# Patient Record
Sex: Female | Born: 1935 | Race: White | Hispanic: No | Smoking: Never smoker
Health system: Southern US, Community
[De-identification: ages and names within clinical notes are randomized; demographics above are authoritative.]

## PROBLEM LIST (undated history)

## (undated) ENCOUNTER — Emergency Department (HOSPITAL_COMMUNITY): Admission: EM | Payer: Self-pay | Source: Home / Self Care

## (undated) DIAGNOSIS — I10 Essential (primary) hypertension: Secondary | ICD-10-CM

## (undated) DIAGNOSIS — R519 Headache, unspecified: Secondary | ICD-10-CM

## (undated) DIAGNOSIS — R51 Headache: Secondary | ICD-10-CM

## (undated) DIAGNOSIS — F419 Anxiety disorder, unspecified: Secondary | ICD-10-CM

## (undated) HISTORY — PX: OTHER SURGICAL HISTORY: SHX169

## (undated) HISTORY — PX: EYE SURGERY: SHX253

## (undated) HISTORY — PX: SKIN BIOPSY: SHX1

---

## 2007-08-27 ENCOUNTER — Emergency Department (HOSPITAL_COMMUNITY): Admission: EM | Admit: 2007-08-27 | Discharge: 2007-08-27 | Payer: Self-pay | Admitting: Emergency Medicine

## 2011-09-27 DIAGNOSIS — IMO0002 Reserved for concepts with insufficient information to code with codable children: Secondary | ICD-10-CM | POA: Diagnosis not present

## 2011-09-27 DIAGNOSIS — I10 Essential (primary) hypertension: Secondary | ICD-10-CM | POA: Diagnosis not present

## 2011-09-27 DIAGNOSIS — E785 Hyperlipidemia, unspecified: Secondary | ICD-10-CM | POA: Diagnosis not present

## 2011-10-07 DIAGNOSIS — L03019 Cellulitis of unspecified finger: Secondary | ICD-10-CM | POA: Diagnosis not present

## 2011-10-07 DIAGNOSIS — J069 Acute upper respiratory infection, unspecified: Secondary | ICD-10-CM | POA: Diagnosis not present

## 2011-10-07 DIAGNOSIS — IMO0002 Reserved for concepts with insufficient information to code with codable children: Secondary | ICD-10-CM | POA: Diagnosis not present

## 2012-01-27 ENCOUNTER — Ambulatory Visit (HOSPITAL_COMMUNITY)
Admission: RE | Admit: 2012-01-27 | Discharge: 2012-01-27 | Disposition: A | Discharge status: Home / Self Care | Payer: Medicare Other | Source: Ambulatory Visit | Attending: Physician Assistant | Admitting: Physician Assistant

## 2012-01-27 ENCOUNTER — Other Ambulatory Visit (HOSPITAL_COMMUNITY): Payer: Self-pay | Admitting: Physician Assistant

## 2012-01-27 DIAGNOSIS — M25569 Pain in unspecified knee: Secondary | ICD-10-CM | POA: Diagnosis not present

## 2012-01-27 DIAGNOSIS — M25469 Effusion, unspecified knee: Secondary | ICD-10-CM | POA: Diagnosis not present

## 2012-10-08 DIAGNOSIS — I10 Essential (primary) hypertension: Secondary | ICD-10-CM | POA: Diagnosis not present

## 2012-10-08 DIAGNOSIS — E785 Hyperlipidemia, unspecified: Secondary | ICD-10-CM | POA: Diagnosis not present

## 2012-10-08 DIAGNOSIS — Z681 Body mass index (BMI) 19 or less, adult: Secondary | ICD-10-CM | POA: Diagnosis not present

## 2013-03-11 DIAGNOSIS — Z681 Body mass index (BMI) 19 or less, adult: Secondary | ICD-10-CM | POA: Diagnosis not present

## 2013-03-11 DIAGNOSIS — R5381 Other malaise: Secondary | ICD-10-CM | POA: Diagnosis not present

## 2013-03-11 DIAGNOSIS — I10 Essential (primary) hypertension: Secondary | ICD-10-CM | POA: Diagnosis not present

## 2013-03-11 DIAGNOSIS — J309 Allergic rhinitis, unspecified: Secondary | ICD-10-CM | POA: Diagnosis not present

## 2013-05-27 DIAGNOSIS — Z681 Body mass index (BMI) 19 or less, adult: Secondary | ICD-10-CM | POA: Diagnosis not present

## 2013-05-27 DIAGNOSIS — L570 Actinic keratosis: Secondary | ICD-10-CM | POA: Diagnosis not present

## 2013-06-25 DIAGNOSIS — C4441 Basal cell carcinoma of skin of scalp and neck: Secondary | ICD-10-CM | POA: Diagnosis not present

## 2013-06-25 DIAGNOSIS — C4491 Basal cell carcinoma of skin, unspecified: Secondary | ICD-10-CM | POA: Diagnosis not present

## 2013-07-23 DIAGNOSIS — Z681 Body mass index (BMI) 19 or less, adult: Secondary | ICD-10-CM | POA: Diagnosis not present

## 2013-07-23 DIAGNOSIS — F411 Generalized anxiety disorder: Secondary | ICD-10-CM | POA: Diagnosis not present

## 2013-10-02 DIAGNOSIS — C4441 Basal cell carcinoma of skin of scalp and neck: Secondary | ICD-10-CM | POA: Diagnosis not present

## 2013-10-02 DIAGNOSIS — F411 Generalized anxiety disorder: Secondary | ICD-10-CM | POA: Diagnosis not present

## 2013-10-02 DIAGNOSIS — Z681 Body mass index (BMI) 19 or less, adult: Secondary | ICD-10-CM | POA: Diagnosis not present

## 2014-07-10 DIAGNOSIS — R5383 Other fatigue: Secondary | ICD-10-CM | POA: Diagnosis not present

## 2014-07-10 DIAGNOSIS — E559 Vitamin D deficiency, unspecified: Secondary | ICD-10-CM | POA: Diagnosis not present

## 2014-07-10 DIAGNOSIS — J302 Other seasonal allergic rhinitis: Secondary | ICD-10-CM | POA: Diagnosis not present

## 2014-07-10 DIAGNOSIS — C44319 Basal cell carcinoma of skin of other parts of face: Secondary | ICD-10-CM | POA: Diagnosis not present

## 2014-07-10 DIAGNOSIS — Z681 Body mass index (BMI) 19 or less, adult: Secondary | ICD-10-CM | POA: Diagnosis not present

## 2015-02-20 DIAGNOSIS — Z1389 Encounter for screening for other disorder: Secondary | ICD-10-CM | POA: Diagnosis not present

## 2015-02-20 DIAGNOSIS — L570 Actinic keratosis: Secondary | ICD-10-CM | POA: Diagnosis not present

## 2015-02-20 DIAGNOSIS — Z681 Body mass index (BMI) 19 or less, adult: Secondary | ICD-10-CM | POA: Diagnosis not present

## 2015-03-13 DIAGNOSIS — L570 Actinic keratosis: Secondary | ICD-10-CM | POA: Diagnosis not present

## 2015-03-13 DIAGNOSIS — Z1389 Encounter for screening for other disorder: Secondary | ICD-10-CM | POA: Diagnosis not present

## 2015-03-13 DIAGNOSIS — Z681 Body mass index (BMI) 19 or less, adult: Secondary | ICD-10-CM | POA: Diagnosis not present

## 2015-03-13 DIAGNOSIS — J302 Other seasonal allergic rhinitis: Secondary | ICD-10-CM | POA: Diagnosis not present

## 2015-04-01 DIAGNOSIS — Z681 Body mass index (BMI) 19 or less, adult: Secondary | ICD-10-CM | POA: Diagnosis not present

## 2015-04-01 DIAGNOSIS — Z1389 Encounter for screening for other disorder: Secondary | ICD-10-CM | POA: Diagnosis not present

## 2015-04-01 DIAGNOSIS — G441 Vascular headache, not elsewhere classified: Secondary | ICD-10-CM | POA: Diagnosis not present

## 2015-04-01 DIAGNOSIS — J019 Acute sinusitis, unspecified: Secondary | ICD-10-CM | POA: Diagnosis not present

## 2015-04-06 DIAGNOSIS — H4311 Vitreous hemorrhage, right eye: Secondary | ICD-10-CM | POA: Diagnosis not present

## 2015-04-15 ENCOUNTER — Other Ambulatory Visit (HOSPITAL_COMMUNITY): Payer: Self-pay | Admitting: Physician Assistant

## 2015-04-15 DIAGNOSIS — H5713 Ocular pain, bilateral: Secondary | ICD-10-CM

## 2015-04-17 ENCOUNTER — Other Ambulatory Visit (HOSPITAL_COMMUNITY): Payer: Medicare Other

## 2015-04-22 ENCOUNTER — Emergency Department (HOSPITAL_COMMUNITY): Payer: Medicare Other

## 2015-04-22 ENCOUNTER — Emergency Department (HOSPITAL_COMMUNITY)
Admission: EM | Admit: 2015-04-22 | Discharge: 2015-04-22 | Discharge status: Against Medical Advice | Payer: Medicare Other | Attending: Emergency Medicine | Admitting: Emergency Medicine

## 2015-04-22 ENCOUNTER — Encounter (HOSPITAL_COMMUNITY): Payer: Self-pay

## 2015-04-22 DIAGNOSIS — R5383 Other fatigue: Secondary | ICD-10-CM | POA: Insufficient documentation

## 2015-04-22 DIAGNOSIS — Z791 Long term (current) use of non-steroidal anti-inflammatories (NSAID): Secondary | ICD-10-CM | POA: Diagnosis not present

## 2015-04-22 DIAGNOSIS — R0789 Other chest pain: Secondary | ICD-10-CM | POA: Diagnosis not present

## 2015-04-22 DIAGNOSIS — I639 Cerebral infarction, unspecified: Secondary | ICD-10-CM | POA: Diagnosis not present

## 2015-04-22 DIAGNOSIS — I10 Essential (primary) hypertension: Secondary | ICD-10-CM | POA: Diagnosis not present

## 2015-04-22 DIAGNOSIS — R51 Headache: Secondary | ICD-10-CM | POA: Diagnosis not present

## 2015-04-22 DIAGNOSIS — F419 Anxiety disorder, unspecified: Secondary | ICD-10-CM | POA: Insufficient documentation

## 2015-04-22 DIAGNOSIS — R079 Chest pain, unspecified: Secondary | ICD-10-CM

## 2015-04-22 DIAGNOSIS — G8929 Other chronic pain: Secondary | ICD-10-CM | POA: Diagnosis not present

## 2015-04-22 DIAGNOSIS — R0602 Shortness of breath: Secondary | ICD-10-CM | POA: Diagnosis not present

## 2015-04-22 DIAGNOSIS — R531 Weakness: Secondary | ICD-10-CM | POA: Diagnosis not present

## 2015-04-22 DIAGNOSIS — J439 Emphysema, unspecified: Secondary | ICD-10-CM | POA: Diagnosis not present

## 2015-04-22 DIAGNOSIS — R61 Generalized hyperhidrosis: Secondary | ICD-10-CM | POA: Insufficient documentation

## 2015-04-22 DIAGNOSIS — Z79899 Other long term (current) drug therapy: Secondary | ICD-10-CM | POA: Diagnosis not present

## 2015-04-22 DIAGNOSIS — R110 Nausea: Secondary | ICD-10-CM | POA: Diagnosis not present

## 2015-04-22 HISTORY — DX: Headache: R51

## 2015-04-22 HISTORY — DX: Headache, unspecified: R51.9

## 2015-04-22 HISTORY — DX: Essential (primary) hypertension: I10

## 2015-04-22 HISTORY — DX: Anxiety disorder, unspecified: F41.9

## 2015-04-22 LAB — CBC WITH DIFFERENTIAL/PLATELET
BASOS ABS: 0 10*3/uL (ref 0.0–0.1)
Basophils Relative: 0 %
EOS ABS: 0.1 10*3/uL (ref 0.0–0.7)
EOS PCT: 2 %
HCT: 40.4 % (ref 36.0–46.0)
HEMOGLOBIN: 12.7 g/dL (ref 12.0–15.0)
LYMPHS ABS: 0.8 10*3/uL (ref 0.7–4.0)
LYMPHS PCT: 12 %
MCH: 30.6 pg (ref 26.0–34.0)
MCHC: 31.4 g/dL (ref 30.0–36.0)
MCV: 97.3 fL (ref 78.0–100.0)
Monocytes Absolute: 0.4 10*3/uL (ref 0.1–1.0)
Monocytes Relative: 6 %
NEUTROS PCT: 80 %
Neutro Abs: 5.5 10*3/uL (ref 1.7–7.7)
PLATELETS: 227 10*3/uL (ref 150–400)
RBC: 4.15 MIL/uL (ref 3.87–5.11)
RDW: 13.7 % (ref 11.5–15.5)
WBC: 6.9 10*3/uL (ref 4.0–10.5)

## 2015-04-22 LAB — BASIC METABOLIC PANEL
ANION GAP: 7 (ref 5–15)
BUN: 19 mg/dL (ref 6–20)
CALCIUM: 10 mg/dL (ref 8.9–10.3)
CO2: 31 mmol/L (ref 22–32)
Chloride: 103 mmol/L (ref 101–111)
Creatinine, Ser: 1.06 mg/dL — ABNORMAL HIGH (ref 0.44–1.00)
GFR, EST AFRICAN AMERICAN: 52 mL/min — AB (ref >60)
GFR, EST NON AFRICAN AMERICAN: 45 mL/min — AB (ref >60)
Glucose, Bld: 111 mg/dL — ABNORMAL HIGH (ref 65–99)
POTASSIUM: 4.1 mmol/L (ref 3.5–5.1)
Sodium: 141 mmol/L (ref 135–145)

## 2015-04-22 LAB — TROPONIN I

## 2015-04-22 MED ORDER — ACETAMINOPHEN 500 MG PO TABS
1000.0000 mg | ORAL_TABLET | Freq: Once | ORAL | Status: AC
  Administered 2015-04-22: 1000 mg via ORAL
  Filled 2015-04-22: qty 2

## 2015-04-22 MED ORDER — NITROGLYCERIN 0.4 MG SL SUBL: 0.4000 mg | SUBLINGUAL_TABLET | SUBLINGUAL | Status: DC | PRN

## 2015-04-22 MED ORDER — ASPIRIN 81 MG PO CHEW
324.0000 mg | CHEWABLE_TABLET | Freq: Once | ORAL | Status: DC
  Filled 2015-04-22: qty 4

## 2015-04-22 NOTE — ED Notes (Signed)
Patient reports of chest pain "for a while now on and off but worse this morning." States she has taken Advil with no relief. Reports of shortness of breath with pain. NADN during triage. C/o headache that got worse this am.

## 2015-04-22 NOTE — ED Notes (Signed)
Patient refuses IV. States wants to wait until results of CT and xray are back.

## 2015-04-22 NOTE — ED Provider Notes (Signed)
CSN: [id number]     Arrival date & time 04/22/15  1114 History   First MD Initiated Contact with Patient 04/22/15 1124     Chief Complaint  Patient presents with  . Chest Pain      HPI Pt was seen at 1130. Per pt, c/o gradual onset and worsening of multiple intermittent episodes of "chest pain" for the past 3 days. Pt describes the CP as "aching," and located in her mid-chest area. Has been associated with SOB, diaphoresis, and nausea. Pt states the discomfort was "worse" this morning, but is unable to further explain how. Pt states her symptoms have been occurring at rest or on exertion, and last "at least an hour" before resolving. Pt "thinks" her symptoms improve with resting. Denies palpitations, no cough, no abd pain, no N/V/D, no back pain.  Pt also c/o gradual onset and persistence of constant acute flair of her chronic frontal headache for the past several days.  Describes the headache as per her usual chronic migraine headache pain pattern for many years. Pt has not taken any meds today to treat her headache. Pt has intermittently been taking advil without relief.  Denies headache was sudden or maximal in onset or at any time.  Denies visual changes, no focal motor weakness, no tingling/numbness in extremities, no fevers, no neck pain, no rash.      Past Medical History  Diagnosis Date  . Hypertension   . Anxiety   . Headache    Past Surgical History  Procedure Laterality Date  . Eye surgery    . Skin biopsy      Social History  Substance Use Topics  . Smoking status: Never Smoker   . Smokeless tobacco: None  . Alcohol Use: No    Review of Systems ROS: Statement: All systems negative except as marked or noted in the HPI; Constitutional: Negative for fever and chills. +generalized fatigue/weakness.; ; Eyes: Negative for eye pain, redness and discharge. ; ; ENMT: Negative for ear pain, hoarseness, nasal congestion, sinus pressure and sore throat. ; ; Cardiovascular: +CP, SOB,  diaphoresis. Negative for palpitations and peripheral edema. ; ; Respiratory: Negative for cough, wheezing and stridor. ; ; Gastrointestinal: +nausea. Negative for vomiting, diarrhea, abdominal pain, blood in stool, hematemesis, jaundice and rectal bleeding. . ; ; Genitourinary: Negative for dysuria, flank pain and hematuria. ; ; Musculoskeletal: Negative for back pain and neck pain. Negative for swelling and trauma.; ; Skin: Negative for pruritus, rash, abrasions, blisters, bruising and skin lesion.; ; Neuro: +frontal headache. Negative for lightheadedness and neck stiffness. Negative for altered level of consciousness , altered mental status, extremity weakness, paresthesias, involuntary movement, seizure and syncope.       Allergies  Review of patient's allergies indicates no known allergies.  Home Medications   Prior to Admission medications   Medication Sig Start Date End Date Taking? Authorizing Provider  ALPRAZolam (XANAX) 0.25 MG tablet Take 0.25 mg by mouth 3 (three) times daily as needed for anxiety.   Yes Historical Provider, MD  amLODipine (NORVASC) 10 MG tablet Take 10 mg by mouth daily.   Yes Historical Provider, MD  naproxen sodium (ANAPROX) 220 MG tablet Take 220 mg by mouth 2 (two) times daily with a meal.   Yes Historical Provider, MD   BP 137/69 mmHg  Pulse 63  Temp(Src) 97.5 F (36.4 C) (Oral)  Resp 16  Ht 5\' 2"  (1.575 m)  Wt 95 lb (43.092 kg)  BMI 17.37 kg/m2  SpO2 100% Physical  Exam  1135: Physical examination:  Nursing notes reviewed; Vital signs and O2 SAT reviewed;  Constitutional: Thin, frail. In no acute distress; Head:  Normocephalic, atraumatic; Eyes: EOMI, PERRL, No scleral icterus; ENMT: TM's clear bilat. +edemetous nasal turbinates bilat with clear rhinorrhea.  Mouth and pharynx normal, Mucous membranes dry; Neck: Supple, Full range of motion, No lymphadenopathy; Cardiovascular: Regular rate and rhythm, No gallop; Respiratory: Breath sounds clear & equal  bilaterally, No wheezes.  Speaking full sentences with ease, Normal respiratory effort/excursion; Chest: Nontender, Movement normal; Abdomen: Soft, Nontender, Nondistended, Normal bowel sounds; Genitourinary: No CVA tenderness; Extremities: Pulses normal, No tenderness, No edema, No calf edema or asymmetry.; Neuro: AA&Ox3, vague/rambling historian. Major CN grossly intact. No facial droop. Speech clear. No gross focal motor or sensory deficits in extremities.; Skin: Color normal, Warm, Dry.   ED Course  Procedures (including critical care time) Labs Review   Imaging Review  I have personally reviewed and evaluated these images and lab results as part of my medical decision-making.   EKG Interpretation   Date/Time:  Wednesday April 22 2015 11:21:45 EDT Ventricular Rate:  70 PR Interval:  128 QRS Duration: 78 QT Interval:  378 QTC Calculation: 408 R Axis:   33 Text Interpretation:  Sinus rhythm No old tracing to compare Confirmed by  [facility]  MD, Nunzio Cory [id number]) on 04/22/2015 11:49:58 AM      MDM  MDM Reviewed: previous chart, nursing note and vitals Interpretation: labs, ECG, x-ray and CT scan     Results for orders placed or performed during the hospital encounter of 04/22/15  CBC with Differential  Result Value Ref Range   WBC 6.9 4.0 - 10.5 K/uL   RBC 4.15 3.87 - 5.11 MIL/uL   Hemoglobin 12.7 12.0 - 15.0 g/dL   HCT 40.4 36.0 - 46.0 %   MCV 97.3 78.0 - 100.0 fL   MCH 30.6 26.0 - 34.0 pg   MCHC 31.4 30.0 - 36.0 g/dL   RDW 13.7 11.5 - 15.5 %   Platelets 227 150 - 400 K/uL   Neutrophils Relative % 80 %   Neutro Abs 5.5 1.7 - 7.7 K/uL   Lymphocytes Relative 12 %   Lymphs Abs 0.8 0.7 - 4.0 K/uL   Monocytes Relative 6 %   Monocytes Absolute 0.4 0.1 - 1.0 K/uL   Eosinophils Relative 2 %   Eosinophils Absolute 0.1 0.0 - 0.7 K/uL   Basophils Relative 0 %   Basophils Absolute 0.0 0.0 - 0.1 K/uL  Troponin I  Result Value Ref Range   Troponin I <0.03 <0.031 ng/mL   Basic metabolic panel  Result Value Ref Range   Sodium 141 135 - 145 mmol/L   Potassium 4.1 3.5 - 5.1 mmol/L   Chloride 103 101 - 111 mmol/L   CO2 31 22 - 32 mmol/L   Glucose, Bld 111 (H) 65 - 99 mg/dL   BUN 19 6 - 20 mg/dL   Creatinine, Ser 1.06 (H) 0.44 - 1.00 mg/dL   Calcium 10.0 8.9 - 10.3 mg/dL   GFR calc non Af Amer 45 (L) >60 mL/min   GFR calc Af Amer 52 (L) >60 mL/min   Anion gap 7 5 - 15   Dg Chest 2 View 04/22/2015  CLINICAL DATA:  81 year old female with a history of chest pain. EXAM: CHEST - 2 VIEW COMPARISON:  None. FINDINGS: Cardiomediastinal silhouette borderline enlarged. Atherosclerotic calcifications of the aortic arch. No evidence of pulmonary vascular congestion. Stigmata of emphysema, with increased retrosternal  airspace, flattened hemidiaphragms, increased AP diameter, and hyperinflation on the AP view. Interstitial opacities present throughout with appearance of bronchial wall thickening. No confluent airspace disease. No pleural effusion. No pneumothorax. No displaced fracture. Unremarkable appearance of the upper abdomen. IMPRESSION: Changes of advanced emphysema with interstitial opacities potentially chronic scarring, or alternatively representing acute or chronic bronchitis. Correlation of presentation may be useful. No evidence of lobar pneumonia. Atherosclerosis. Signed, Dulcy Fanny. Earleen Newport, DO [facility] Electronically Signed   By: Corrie Mckusick D.O.   On: 04/22/2015 12:23   Ct Head Wo Contrast 04/22/2015  CLINICAL DATA:  81 year old female with a history of headache EXAM: CT HEAD WITHOUT CONTRAST TECHNIQUE: Contiguous axial images were obtained from the base of the skull through the vertex without intravenous contrast. COMPARISON:  None. FINDINGS: Unremarkable appearance of the calvarium without acute fracture or aggressive lesion. Unremarkable appearance of the scalp soft tissues. Unremarkable appearance of the  right orbit. Prior left-sided scleral banding. Bilateral lens extraction. Mastoid air cells are clear. No significant paranasal sinus disease No acute intracranial hemorrhage.  No midline shift or mass effect. Confluent hypodensity in the periventricular white matter bilaterally. No comparison available. Gray-white differentiation is relatively maintained. Degree of ventricular prominence not out of proportion to that of the overlying sulci. Intracranial atherosclerosis. IMPRESSION: No CT evidence of acute intracranial abnormality. Changes of chronic microvascular ischemic disease with associated intracranial atherosclerosis. Signed, Dulcy Fanny. Earleen Newport, DO [facility] Electronically Signed   By: Corrie Mckusick D.O.   On: 04/22/2015 12:53    1325:  Pt refuses IV, ASA, and SL ntg. Pt states she "feels better" after APAP and wants to go home. Pt and family informed re: dx testing results, including inability to r/o cardiac source for her intermittent CP, and that I recommend observation admission for further evaluation.  Pt refuses admission.  I encouraged pt to stay, continues to refuse.  Pt makes her own medical decisions.  Risks of AMA explained to pt and family, including, but not limited to:  stroke, heart attack, cardiac arrythmia ("irregular heart rate/beat"), "passing out," temporary and/or permanent disability, death.  Pt and family verb understanding and continue to refuse admission, understanding the consequences of their decision.  I encouraged pt to follow up with her PMD tomorrow and return to the ED immediately if symptoms worsen, return, she changes her mind about admission, or for any other concerns.  Pt and family verb understanding, agreeable.   Francine Graven, DO 04/26/15 2033

## 2015-04-28 DIAGNOSIS — J302 Other seasonal allergic rhinitis: Secondary | ICD-10-CM | POA: Diagnosis not present

## 2015-04-28 DIAGNOSIS — Z1389 Encounter for screening for other disorder: Secondary | ICD-10-CM | POA: Diagnosis not present

## 2015-04-28 DIAGNOSIS — Z681 Body mass index (BMI) 19 or less, adult: Secondary | ICD-10-CM | POA: Diagnosis not present

## 2015-04-28 DIAGNOSIS — J300 Vasomotor rhinitis: Secondary | ICD-10-CM | POA: Diagnosis not present

## 2015-08-03 DIAGNOSIS — H6122 Impacted cerumen, left ear: Secondary | ICD-10-CM | POA: Diagnosis not present

## 2015-08-03 DIAGNOSIS — Z681 Body mass index (BMI) 19 or less, adult: Secondary | ICD-10-CM | POA: Diagnosis not present

## 2015-08-03 DIAGNOSIS — J310 Chronic rhinitis: Secondary | ICD-10-CM | POA: Diagnosis not present

## 2015-08-03 DIAGNOSIS — Z1389 Encounter for screening for other disorder: Secondary | ICD-10-CM | POA: Diagnosis not present

## 2016-01-19 DIAGNOSIS — Z1389 Encounter for screening for other disorder: Secondary | ICD-10-CM | POA: Diagnosis not present

## 2016-01-19 DIAGNOSIS — I10 Essential (primary) hypertension: Secondary | ICD-10-CM | POA: Diagnosis not present

## 2016-01-19 DIAGNOSIS — Z681 Body mass index (BMI) 19 or less, adult: Secondary | ICD-10-CM | POA: Diagnosis not present

## 2016-01-19 DIAGNOSIS — L659 Nonscarring hair loss, unspecified: Secondary | ICD-10-CM | POA: Diagnosis not present

## 2016-07-24 ENCOUNTER — Observation Stay (HOSPITAL_COMMUNITY)
Admission: EM | Admit: 2016-07-24 | Discharge: 2016-07-25 | Disposition: A | Discharge status: Home / Self Care | Payer: Medicare Other | Attending: Family Medicine | Admitting: Family Medicine

## 2016-07-24 ENCOUNTER — Emergency Department (HOSPITAL_COMMUNITY): Payer: Medicare Other

## 2016-07-24 ENCOUNTER — Encounter (HOSPITAL_COMMUNITY): Payer: Self-pay | Admitting: Emergency Medicine

## 2016-07-24 DIAGNOSIS — I119 Hypertensive heart disease without heart failure: Secondary | ICD-10-CM | POA: Insufficient documentation

## 2016-07-24 DIAGNOSIS — Z79899 Other long term (current) drug therapy: Secondary | ICD-10-CM | POA: Diagnosis not present

## 2016-07-24 DIAGNOSIS — Z791 Long term (current) use of non-steroidal anti-inflammatories (NSAID): Secondary | ICD-10-CM | POA: Insufficient documentation

## 2016-07-24 DIAGNOSIS — R748 Abnormal levels of other serum enzymes: Secondary | ICD-10-CM | POA: Diagnosis not present

## 2016-07-24 DIAGNOSIS — F419 Anxiety disorder, unspecified: Secondary | ICD-10-CM | POA: Diagnosis not present

## 2016-07-24 DIAGNOSIS — R778 Other specified abnormalities of plasma proteins: Secondary | ICD-10-CM | POA: Diagnosis not present

## 2016-07-24 DIAGNOSIS — E860 Dehydration: Secondary | ICD-10-CM | POA: Diagnosis not present

## 2016-07-24 DIAGNOSIS — S299XXA Unspecified injury of thorax, initial encounter: Secondary | ICD-10-CM | POA: Diagnosis not present

## 2016-07-24 DIAGNOSIS — S0990XA Unspecified injury of head, initial encounter: Secondary | ICD-10-CM | POA: Diagnosis not present

## 2016-07-24 DIAGNOSIS — I700 Atherosclerosis of aorta: Secondary | ICD-10-CM | POA: Diagnosis not present

## 2016-07-24 DIAGNOSIS — Z66 Do not resuscitate: Secondary | ICD-10-CM | POA: Diagnosis not present

## 2016-07-24 DIAGNOSIS — R531 Weakness: Principal | ICD-10-CM

## 2016-07-24 DIAGNOSIS — M545 Low back pain: Secondary | ICD-10-CM | POA: Diagnosis not present

## 2016-07-24 DIAGNOSIS — R7989 Other specified abnormal findings of blood chemistry: Secondary | ICD-10-CM | POA: Diagnosis present

## 2016-07-24 DIAGNOSIS — S3992XA Unspecified injury of lower back, initial encounter: Secondary | ICD-10-CM | POA: Diagnosis not present

## 2016-07-24 LAB — COMPREHENSIVE METABOLIC PANEL
ALBUMIN: 4.1 g/dL (ref 3.5–5.0)
ALT: 22 U/L (ref 14–54)
AST: 26 U/L (ref 15–41)
Alkaline Phosphatase: 58 U/L (ref 38–126)
Anion gap: 9 (ref 5–15)
BUN: 23 mg/dL — AB (ref 6–20)
CHLORIDE: 100 mmol/L — AB (ref 101–111)
CO2: 31 mmol/L (ref 22–32)
CREATININE: 1.07 mg/dL — AB (ref 0.44–1.00)
Calcium: 10.3 mg/dL (ref 8.9–10.3)
GFR calc Af Amer: 51 mL/min — ABNORMAL LOW (ref >60)
GFR calc non Af Amer: 44 mL/min — ABNORMAL LOW (ref >60)
GLUCOSE: 107 mg/dL — AB (ref 65–99)
Potassium: 4.3 mmol/L (ref 3.5–5.1)
SODIUM: 140 mmol/L (ref 135–145)
Total Bilirubin: 0.6 mg/dL (ref 0.3–1.2)
Total Protein: 7.2 g/dL (ref 6.5–8.1)

## 2016-07-24 LAB — URINALYSIS, ROUTINE W REFLEX MICROSCOPIC
Bilirubin Urine: NEGATIVE
GLUCOSE, UA: NEGATIVE mg/dL
Hgb urine dipstick: NEGATIVE
Ketones, ur: NEGATIVE mg/dL
Nitrite: NEGATIVE
PH: 7 (ref 5.0–8.0)
PROTEIN: NEGATIVE mg/dL
Specific Gravity, Urine: 1.009 (ref 1.005–1.030)

## 2016-07-24 LAB — CBC WITH DIFFERENTIAL/PLATELET
Basophils Absolute: 0 10*3/uL (ref 0.0–0.1)
Basophils Relative: 0 %
Eosinophils Absolute: 0.1 10*3/uL (ref 0.0–0.7)
Eosinophils Relative: 1 %
HCT: 40.7 % (ref 36.0–46.0)
HEMOGLOBIN: 13.1 g/dL (ref 12.0–15.0)
LYMPHS ABS: 0.8 10*3/uL (ref 0.7–4.0)
LYMPHS PCT: 9 %
MCH: 31.8 pg (ref 26.0–34.0)
MCHC: 32.2 g/dL (ref 30.0–36.0)
MCV: 98.8 fL (ref 78.0–100.0)
MONO ABS: 0.4 10*3/uL (ref 0.1–1.0)
MONOS PCT: 5 %
NEUTROS ABS: 7.8 10*3/uL — AB (ref 1.7–7.7)
Neutrophils Relative %: 85 %
Platelets: 230 10*3/uL (ref 150–400)
RBC: 4.12 MIL/uL (ref 3.87–5.11)
RDW: 14.5 % (ref 11.5–15.5)
WBC: 9.1 10*3/uL (ref 4.0–10.5)

## 2016-07-24 LAB — APTT: APTT: 25 s (ref 24–36)

## 2016-07-24 LAB — TROPONIN I
Troponin I: 0.03 ng/mL (ref <0.03)
Troponin I: 0.06 ng/mL (ref <0.03)

## 2016-07-24 LAB — CBG MONITORING, ED: Glucose-Capillary: 114 mg/dL — ABNORMAL HIGH (ref 65–99)

## 2016-07-24 LAB — LACTIC ACID, PLASMA: Lactic Acid, Venous: 1 mmol/L (ref 0.5–1.9)

## 2016-07-24 MED ORDER — ASPIRIN 325 MG PO TABS
325.0000 mg | ORAL_TABLET | Freq: Once | ORAL | Status: AC
  Administered 2016-07-24: 325 mg via ORAL
  Filled 2016-07-24: qty 1

## 2016-07-24 NOTE — ED Notes (Signed)
CRITICAL VALUE ALERT  Critical value received:  Troponin 0.06  Date of notification:  07/24/16  Time of notification:  2214  Critical value read back:Yes.    Nurse who received alert:  Derek Mound, RN  MD notified (1st page):  Viviana Simpler  Time of first page:  2215  MD notified (2nd page):  Time of second page:  Responding MD:  Viviana Simpler  Time MD responded:  2215

## 2016-07-24 NOTE — ED Triage Notes (Addendum)
Per EMS: Pt from home, EMS was called out for fall.  Pt describes that she was walking in kitchen and felt knees get weak and fell onto buttocks. Pt mentions that she had large bm after 3 days of constipation prior to event.  Pt c/o lower back pain at this time and generalized body aches.  Denies head trauma or LOC.  Pt alert and oriented.

## 2016-07-24 NOTE — ED Notes (Addendum)
CRITICAL VALUE ALERT  Critical value received:  Troponin 0.03  Date of notification:  07/24/16  Time of notification:  2020  Critical value read back:Yes.    Nurse who received alert:  Derek Mound, RN  MD notified (1st page):  Thurnell Garbe  Time of first page:  2020  MD notified (2nd page): Mcmanus  Time of second page:  Responding MD:  Viviana Simpler   Time MD responded:  2020

## 2016-07-24 NOTE — ED Provider Notes (Signed)
[facility] DEPT Provider Note   CSN: [id number] Arrival date & time: 07/24/16  1828     History   Chief Complaint Chief Complaint  Patient presents with  . Fall    HPI Sharon Mason is a 83 y.o. female.  HPI  Pt was seen at [facility]. Per pt and her family, c/o gradual onset and worsening of persistent generalized weakness for the past several months. Pt states today she was walking in her kitchen, felt her "knees get weak," and she sat on the floor. Pt states she then slid her way on the floor to the phone to call for help. Pt c/o lower back pain and generalized weakness/fatigue. Pt's family states pt's PMD took pt off all her BP meds approximately 4 weeks ago. Denies hitting head, no syncope/LOC, no AMS, no neck pain, no CP/palpitations, no SOB/cough, no abd pain, no N/V/D, no fevers, no focal motor weakness, no tingling/numbness in extremities.      Past Medical History:  Diagnosis Date  . Anxiety   . Headache   . Hypertension     There are no active problems to display for this patient.   Past Surgical History:  Procedure Laterality Date  . EYE SURGERY    . SKIN BIOPSY      OB History    No data available       Home Medications    Prior to Admission medications   Medication Sig Start Date End Date Taking? Authorizing Provider  ALPRAZolam (XANAX) 0.25 MG tablet Take 0.25 mg by mouth 3 (three) times daily as needed for anxiety.    Historical Provider, MD  amLODipine (NORVASC) 10 MG tablet Take 10 mg by mouth daily.    Historical Provider, MD  naproxen sodium (ANAPROX) 220 MG tablet Take 220 mg by mouth 2 (two) times daily with a meal.    Historical Provider, MD  shark liver oil-cocoa butter (PREPARATION H) 0.25-88.44 % suppository Place 1 suppository rectally as needed for hemorrhoids.    Historical Provider, MD  Sodium Phosphates (FLEET ENEMA RE) Place 1 Bottle rectally as needed (consitpation).    Historical Provider, MD    Family History History reviewed.  No pertinent family history.  Social History Social History  Substance Use Topics  . Smoking status: Never Smoker  . Smokeless tobacco: Not on file  . Alcohol use No     Allergies   Patient has no known allergies.   Review of Systems Review of Systems ROS: Statement: All systems negative except as marked or noted in the HPI; Constitutional: Negative for fever and chills. +generalized weakness/fatigue.; ; Eyes: Negative for eye pain, redness and discharge. ; ; ENMT: Negative for ear pain, hoarseness, nasal congestion, sinus pressure and sore throat. ; ; Cardiovascular: Negative for chest pain, palpitations, diaphoresis, dyspnea and peripheral edema. ; ; Respiratory: Negative for cough, wheezing and stridor. ; ; Gastrointestinal: Negative for nausea, vomiting, diarrhea, abdominal pain, blood in stool, hematemesis, jaundice and rectal bleeding. . ; ; Genitourinary: Negative for dysuria, flank pain and hematuria. ; ; Musculoskeletal: +LBP. Negative for neck pain. Negative for swelling and trauma.; ; Skin: Negative for pruritus, rash, abrasions, blisters, bruising and skin lesion.; ; Neuro: Negative for headache, lightheadedness and neck stiffness. Negative for altered level of consciousness, altered mental status, extremity weakness, paresthesias, involuntary movement, seizure and syncope.       Physical Exam Updated Vital Signs BP 185/86 (BP Location: Left Arm)   Pulse 69   Temp 97.9 F (36.6  C) (Oral)   Resp 18   Ht 5\' 3"  (1.6 m)   Wt 90 lb (40.8 kg)   SpO2 97%   BMI 15.94 kg/m    20:41:38 Orthostatic Vital Signs RH  Orthostatic Lying   BP- Lying:  206/86  Pulse- Lying: 64      Orthostatic Sitting  BP- Sitting:  202/113  Pulse- Sitting: 76      Orthostatic Standing at 0 minutes  BP- Standing at 0 minutes: 178/90  Pulse- Standing at 0 minutes: 94   Patient Vitals for the past 24 hrs:  BP Temp Temp src Pulse Resp SpO2 Height Weight  07/24/16 1930 189/79 - - 78 21 99 %  - -  07/24/16 1833 185/86 97.9 F (36.6 C) Oral 69 18 97 % - -  07/24/16 1830 - - - - - - 5\' 3"  (1.6 m) 90 lb (40.8 kg)     Physical Exam 1900: Physical examination:  Nursing notes reviewed; Vital signs and O2 SAT reviewed;  Constitutional: Thin, frail. Well hydrated, In no acute distress; Head:  Normocephalic, atraumatic; Eyes: EOMI, PERRL, No scleral icterus; ENMT: Mouth and pharynx normal, Mucous membranes moist; Neck: Supple, Full range of motion, No lymphadenopathy; Cardiovascular: Regular rate and rhythm, No gallop; Respiratory: Breath sounds clear & equal bilaterally, No wheezes.  Speaking full sentences with ease, Normal respiratory effort/excursion; Chest: Nontender, Movement normal; Abdomen: Soft, Nontender, Nondistended, Normal bowel sounds; Genitourinary: No CVA tenderness; Spine:  No midline CS, TS, LS tenderness.;;  Extremities: Pulses normal, Pelvis stable. NT bilat hips/knees/ankles/feet. Pt is able to lift extended bilat LE's off stretcher. No deformity, No edema, No calf edema or asymmetry.; Neuro: AA&Ox3, No facial droop. Major CN grossly intact.  Speech clear. Grips equal. Strength 5/5 equal bilat UE's and LE's. Moves all extremities spontaneously and to command without apparent gross focal motor deficits.; Skin: Color normal, Warm, Dry.   ED Treatments / Results  Labs (all labs ordered are listed, but only abnormal results are displayed)   EKG  EKG Interpretation  Date/Time:  Sunday July 24 2016 19:16:41 EST Ventricular Rate:  71 PR Interval:    QRS Duration: 84 QT Interval:  391 QTC Calculation: 425 R Axis:   53 Text Interpretation:  Sinus rhythm Atrial premature complex RSR' in V1 or V2, probably normal variant Consider left ventricular hypertrophy Baseline wander When compared with ECG of 04/22/2015 No significant change was found Confirmed by [facility]  MD, Nunzio Cory [id number]) on 07/24/2016 7:24:26 PM       Radiology   Procedures Procedures (including critical  care time)  Medications Ordered in ED Medications - No data to display   Initial Impression / Assessment and Plan / ED Course  I have reviewed the triage vital signs and the nursing notes.  Pertinent labs & imaging results that were available during my care of the patient were reviewed by me and considered in my medical decision making (see chart for details).  MDM Reviewed: previous chart, nursing note and vitals Reviewed previous: labs and ECG Interpretation: labs, ECG, x-ray and CT scan   Results for orders placed or performed during the hospital encounter of 07/24/16  Comprehensive metabolic panel  Result Value Ref Range   Sodium 140 135 - 145 mmol/L   Potassium 4.3 3.5 - 5.1 mmol/L   Chloride 100 (L) 101 - 111 mmol/L   CO2 31 22 - 32 mmol/L   Glucose, Bld 107 (H) 65 - 99 mg/dL   BUN 23 (H) 6 -  20 mg/dL   Creatinine, Ser 1.07 (H) 0.44 - 1.00 mg/dL   Calcium 10.3 8.9 - 10.3 mg/dL   Total Protein 7.2 6.5 - 8.1 g/dL   Albumin 4.1 3.5 - 5.0 g/dL   AST 26 15 - 41 U/L   ALT 22 14 - 54 U/L   Alkaline Phosphatase 58 38 - 126 U/L   Total Bilirubin 0.6 0.3 - 1.2 mg/dL   GFR calc non Af Amer 44 (L) >60 mL/min   GFR calc Af Amer 51 (L) >60 mL/min   Anion gap 9 5 - 15  Troponin I  Result Value Ref Range   Troponin I 0.03 (HH) <0.03 ng/mL  Lactic acid, plasma  Result Value Ref Range   Lactic Acid, Venous 1.0 0.5 - 1.9 mmol/L  CBC with Differential  Result Value Ref Range   WBC 9.1 4.0 - 10.5 K/uL   RBC 4.12 3.87 - 5.11 MIL/uL   Hemoglobin 13.1 12.0 - 15.0 g/dL   HCT 40.7 36.0 - 46.0 %   MCV 98.8 78.0 - 100.0 fL   MCH 31.8 26.0 - 34.0 pg   MCHC 32.2 30.0 - 36.0 g/dL   RDW 14.5 11.5 - 15.5 %   Platelets 230 150 - 400 K/uL   Neutrophils Relative % 85 %   Neutro Abs 7.8 (H) 1.7 - 7.7 K/uL   Lymphocytes Relative 9 %   Lymphs Abs 0.8 0.7 - 4.0 K/uL   Monocytes Relative 5 %   Monocytes Absolute 0.4 0.1 - 1.0 K/uL   Eosinophils Relative 1 %   Eosinophils Absolute 0.1 0.0 -  0.7 K/uL   Basophils Relative 0 %   Basophils Absolute 0.0 0.0 - 0.1 K/uL  Urinalysis, Routine w reflex microscopic  Result Value Ref Range   Color, Urine YELLOW YELLOW   APPearance CLEAR CLEAR   Specific Gravity, Urine 1.009 1.005 - 1.030   pH 7.0 5.0 - 8.0   Glucose, UA NEGATIVE NEGATIVE mg/dL   Hgb urine dipstick NEGATIVE NEGATIVE   Bilirubin Urine NEGATIVE NEGATIVE   Ketones, ur NEGATIVE NEGATIVE mg/dL   Protein, ur NEGATIVE NEGATIVE mg/dL   Nitrite NEGATIVE NEGATIVE   Leukocytes, UA TRACE (A) NEGATIVE   RBC / HPF 0-5 0 - 5 RBC/hpf   WBC, UA 0-5 0 - 5 WBC/hpf   Bacteria, UA RARE (A) NONE SEEN  CBG monitoring, ED  Result Value Ref Range   Glucose-Capillary 114 (H) 65 - 99 mg/dL   Dg Chest 1 View Result Date: 07/24/2016 CLINICAL DATA:  Fall EXAM: CHEST 1 VIEW COMPARISON:  04/22/2015 FINDINGS: Hyperinflation. No acute consolidation or effusion. No pneumothorax. Mild cardiomegaly. Atherosclerosis of the aorta. IMPRESSION: Hyperinflation. No acute infiltrate, pneumothorax or pleural effusion. Mild cardiomegaly. Electronically Signed   By: Donavan Foil M.D.   On: 07/24/2016 20:26   Dg Lumbar Spine Complete Result Date: 07/24/2016 CLINICAL DATA:  Fall, weakness in the knees EXAM: LUMBAR SPINE - COMPLETE 4+ VIEW COMPARISON:  None. FINDINGS: Alignment is grossly within normal limits. Vertebral body heights are grossly maintained. Degenerative changes at L3-L4, L4-L5 and L5-S1. Atherosclerosis of the aorta. IMPRESSION: Degenerative changes.  No definite acute osseous abnormality. Electronically Signed   By: Donavan Foil M.D.   On: 07/24/2016 20:27   Ct Head Wo Contrast Result Date: 07/24/2016 CLINICAL DATA:  Fall in kitchen.  Weakness. Initial encounter. EXAM: CT HEAD WITHOUT CONTRAST TECHNIQUE: Contiguous axial images were obtained from the base of the skull through the vertex without intravenous contrast. COMPARISON:  04/22/2015 FINDINGS: Brain: No evidence of acute infarction, hemorrhage,  hydrocephalus, extra-axial collection or mass lesion/mass effect. Moderate chronic microvascular disease and atrophy, stable from 2016. Vascular: Atherosclerotic calcification. Skull: Negative for fracture Sinuses/Orbits: Postoperative globes. Mild chronic sinusitis. No acute finding. IMPRESSION: No evidence of intracranial injury or fracture. Electronically Signed   By: Monte Fantasia M.D.   On: 07/24/2016 20:36    2200:  Pt has tol PO well while in the ED without N/V. Pt has ambulated with steady gait (has a walker at home she is not using and should be per family). 1st troponin top of range and EKG unchanged from previous. Pt denies CP now or at any time recently. BUN/Cr per baseline. Family states pt's BP "runs lower at home;" will have pt take at home and keep diary to show PMD at f/u appt.  No clear indication for admission at this time. Family would like to take pt home and pt wants to go home. 2nd troponin pending, dispo based on results.  Sign out to Dr. Oleta Mouse.   Final Clinical Impressions(s) / ED Diagnoses   Final diagnoses:  None    New Prescriptions New Prescriptions   No medications on file     Francine Graven, DO 07/27/16 [id number]

## 2016-07-24 NOTE — ED Notes (Signed)
Pt ambulated in hallway. She she feels weak. Family states she does have a walker at home she has used before when feeling weak.

## 2016-07-24 NOTE — ED Provider Notes (Signed)
.   Please see previous physicians note regarding patient's presenting history and physical, initial ED course, and associated medical decision making.  83 year old female with progressively worsening generalized weakness who presents with worsening weakness today. Pending repeat troponin at time of sign out. Repeat troponin 0.06, double from 3 hours prior. EKG reviewed and w/o ischemic changes. No chest pain, dypsnea, syncope or near syncope. Given aspirin. Discussed with Dr. Marin Comment who will admit for cardiac evaluation.    Forde Dandy, MD 07/24/16 [phone]

## 2016-07-24 NOTE — H&P (Signed)
History and Physical    Sharon Mason [id number] DOB: March 02, 1924 DOA: 07/24/2016  PCP: Purvis Kilts, MD  Patient coming from: Home.    Chief Complaint: weakness.   HPI: Sharon Mason is an 83 y.o. female with benign PMH on only low dose Xanax for anxiety, lives alone with close family support, was "over doing her cleaning" today, and felt weak and had trouble getting up.  She denied fever, chills, CP, nausea, vomiting, black or bloody stool.  Work up in the ER showed normal WBC, normal renal Fx, and normal Hb.  Head CT was negative, CXR was clear, LS spine film showed no Fx, EKG was unremarkable, and troponin was 0.03.  She was going to be discharged but her 3 hours Istat troponin rose to 0.06.  So hospitalist was asked to admit her for "cardiac r/out".      ED Course:  See above.  Rewiew of Systems:  Constitutional: Negative for malaise, fever and chills. No significant weight loss or weight gain Eyes: Negative for eye pain, redness and discharge, diplopia, visual changes, or flashes of light. ENMT: Negative for ear pain, hoarseness, nasal congestion, sinus pressure and sore throat. No headaches; tinnitus, drooling, or problem swallowing. Cardiovascular: Negative for chest pain, palpitations, diaphoresis, dyspnea and peripheral edema. ; No orthopnea, PND Respiratory: Negative for cough, hemoptysis, wheezing and stridor. No pleuritic chestpain. Gastrointestinal: Negative for diarrhea, constipation,  melena, blood in stool, hematemesis, jaundice and rectal bleeding.    Genitourinary: Negative for frequency, dysuria, incontinence,flank pain and hematuria; Musculoskeletal: Negative for back pain and neck pain. Negative for swelling and trauma.;  Skin: . Negative for pruritus, rash, abrasions, bruising and skin lesion.; ulcerations Neuro: Negative for headache, lightheadedness and neck stiffness. Negative for weakness, altered level of consciousness , altered mental status, extremity  weakness, burning feet, involuntary movement, seizure and syncope.  Psych: negative for anxiety, depression, insomnia, tearfulness, panic attacks, hallucinations, paranoia, suicidal or homicidal ideation    Past Medical History:  Diagnosis Date  . Anxiety   . Headache   . Hypertension     Past Surgical History:  Procedure Laterality Date  . EYE SURGERY    . SKIN BIOPSY       reports that she has never smoked. She does not have any smokeless tobacco history on file. She reports that she does not drink alcohol or use drugs.  No Known Allergies  History reviewed. No pertinent family history.   Prior to Admission medications   Medication Sig Start Date End Date Taking? Authorizing Provider  acetaminophen (TYLENOL) 500 MG tablet Take 500 mg by mouth every 6 (six) hours as needed.   Yes Historical Provider, MD  ALPRAZolam (XANAX) 0.25 MG tablet Take 0.25 mg by mouth 3 (three) times daily as needed for anxiety.   Yes Historical Provider, MD  naproxen sodium (ANAPROX) 220 MG tablet Take 220 mg by mouth 2 (two) times daily with a meal.   Yes Historical Provider, MD  shark liver oil-cocoa butter (PREPARATION H) 0.25-88.44 % suppository Place 1 suppository rectally as needed for hemorrhoids.   Yes Historical Provider, MD    Physical Exam: Vitals:   07/24/16 1930 07/24/16 2144 07/24/16 2238 07/24/16 2300  BP: 189/79 194/85 142/71 200/76  Pulse: 78 75  67  Resp: 21 18 16  (!) 9  Temp:      TempSrc:      SpO2: 99% 97%  97%  Weight:      Height:  Constitutional: NAD, calm, comfortable Vitals:   07/24/16 1930 07/24/16 2144 07/24/16 2238 07/24/16 2300  BP: 189/79 194/85 142/71 200/76  Pulse: 78 75  67  Resp: 21 18 16  (!) 9  Temp:      TempSrc:      SpO2: 99% 97%  97%  Weight:      Height:       Eyes: PERRL, lids and conjunctivae normal ENMT: Mucous membranes are moist. Posterior pharynx clear of any exudate or lesions.Normal dentition.  Neck: normal, supple, no  masses, no thyromegaly Respiratory: clear to auscultation bilaterally, no wheezing, no crackles. Normal respiratory effort. No accessory muscle use.  Cardiovascular: Regular rate and rhythm, no murmurs / rubs / gallops. No extremity edema. 2+ pedal pulses. No carotid bruits.  Abdomen: no tenderness, no masses palpated. No hepatosplenomegaly. Bowel sounds positive.  Musculoskeletal: no clubbing / cyanosis. No joint deformity upper and lower extremities. Good ROM, no contractures. Normal muscle tone.  Skin: no rashes, lesions, ulcers. No induration Neurologic: CN 2-12 grossly intact. Sensation intact, DTR normal. Strength 5/5 in all 4.  Psychiatric: Normal judgment and insight. Alert and oriented x 3. Normal mood.    Labs on Admission: I have personally reviewed following labs and imaging studies  CBC:  Recent Labs Lab 07/24/16 1923  WBC 9.1  NEUTROABS 7.8*  HGB 13.1  HCT 40.7  MCV 98.8  PLT 123456   Basic Metabolic Panel:  Recent Labs Lab 07/24/16 1923  NA 140  K 4.3  CL 100*  CO2 31  GLUCOSE 107*  BUN 23*  CREATININE 1.07*  CALCIUM 10.3   GFR: Estimated Creatinine Clearance: 22.5 mL/min (by C-G formula based on SCr of 1.07 mg/dL (H)). Liver Function Tests:  Recent Labs Lab 07/24/16 1923  AST 26  ALT 22  ALKPHOS 58  BILITOT 0.6  PROT 7.2  ALBUMIN 4.1   Cardiac Enzymes:  Recent Labs Lab 07/24/16 1923 07/24/16 2122  TROPONINI 0.03* 0.06*   CBG:  Recent Labs Lab 07/24/16 1841  GLUCAP 114*   Urine analysis:    Component Value Date/Time   COLORURINE YELLOW 07/24/2016 2019   APPEARANCEUR CLEAR 07/24/2016 2019   LABSPEC 1.009 07/24/2016 2019   PHURINE 7.0 07/24/2016 2019   GLUCOSEU NEGATIVE 07/24/2016 2019   HGBUR NEGATIVE 07/24/2016 2019   BILIRUBINUR NEGATIVE 07/24/2016 2019   KETONESUR NEGATIVE 07/24/2016 2019   PROTEINUR NEGATIVE 07/24/2016 2019   NITRITE NEGATIVE 07/24/2016 2019   LEUKOCYTESUR TRACE (A) 07/24/2016 2019   Radiological Exams  on Admission: Dg Chest 1 View  Result Date: 07/24/2016 CLINICAL DATA:  Fall EXAM: CHEST 1 VIEW COMPARISON:  04/22/2015 FINDINGS: Hyperinflation. No acute consolidation or effusion. No pneumothorax. Mild cardiomegaly. Atherosclerosis of the aorta. IMPRESSION: Hyperinflation. No acute infiltrate, pneumothorax or pleural effusion. Mild cardiomegaly. Electronically Signed   By: Donavan Foil M.D.   On: 07/24/2016 20:26   Dg Lumbar Spine Complete  Result Date: 07/24/2016 CLINICAL DATA:  Fall, weakness in the knees EXAM: LUMBAR SPINE - COMPLETE 4+ VIEW COMPARISON:  None. FINDINGS: Alignment is grossly within normal limits. Vertebral body heights are grossly maintained. Degenerative changes at L3-L4, L4-L5 and L5-S1. Atherosclerosis of the aorta. IMPRESSION: Degenerative changes.  No definite acute osseous abnormality. Electronically Signed   By: Donavan Foil M.D.   On: 07/24/2016 20:27   Ct Head Wo Contrast  Result Date: 07/24/2016 CLINICAL DATA:  Fall in kitchen.  Weakness. Initial encounter. EXAM: CT HEAD WITHOUT CONTRAST TECHNIQUE: Contiguous axial images were obtained from the base  of the skull through the vertex without intravenous contrast. COMPARISON:  04/22/2015 FINDINGS: Brain: No evidence of acute infarction, hemorrhage, hydrocephalus, extra-axial collection or mass lesion/mass effect. Moderate chronic microvascular disease and atrophy, stable from 2016. Vascular: Atherosclerotic calcification. Skull: Negative for fracture Sinuses/Orbits: Postoperative globes. Mild chronic sinusitis. No acute finding. IMPRESSION: No evidence of intracranial injury or fracture. Electronically Signed   By: Monte Fantasia M.D.   On: 07/24/2016 20:36    EKG: Independently reviewed.   Assessment/Plan Active Problems:   Elevated troponin    PLAN:   Weakness:  Likely from over cleaning today.  She was able to walk to the BR tonight without any problem.   Elevated troponins:  I suspect it is clinically  unsignificant.  Will cycle troponins, but I would not expect any significant rise. Will give ASA.  If her troponins are flat, should d/c her home.  DVT prophylaxis: Lovenox.  Code Status: DNR.  Confirmed with her and her daughter at bedside.  Family Communication: her only daughter.  Disposition Plan: To home once r/out.  Consults called: None. Admission status: OBS>    Alissia Lory MD FACP. [facility]  If 7PM-7AM, please contact night-coverage [email or web address] Password [email or web address]  07/24/2016, 11:56 PM

## 2016-07-25 ENCOUNTER — Encounter (HOSPITAL_COMMUNITY): Payer: Self-pay

## 2016-07-25 DIAGNOSIS — R531 Weakness: Secondary | ICD-10-CM | POA: Diagnosis not present

## 2016-07-25 DIAGNOSIS — E860 Dehydration: Secondary | ICD-10-CM | POA: Diagnosis present

## 2016-07-25 DIAGNOSIS — R748 Abnormal levels of other serum enzymes: Secondary | ICD-10-CM

## 2016-07-25 LAB — TROPONIN I
Troponin I: 0.05 ng/mL (ref <0.03)
Troponin I: 0.04 ng/mL (ref <0.03)

## 2016-07-25 LAB — TSH: TSH: 2.413 u[IU]/mL (ref 0.350–4.500)

## 2016-07-25 LAB — MRSA PCR SCREENING: MRSA BY PCR: NEGATIVE

## 2016-07-25 MED ORDER — ALPRAZOLAM 0.25 MG PO TABS: 0.2500 mg | ORAL_TABLET | Freq: Three times a day (TID) | ORAL | Status: DC | PRN

## 2016-07-25 MED ORDER — ENOXAPARIN SODIUM 30 MG/0.3ML ~~LOC~~ SOLN: 30.0000 mg | SUBCUTANEOUS | Status: DC

## 2016-07-25 MED ORDER — ASPIRIN EC 81 MG PO TBEC: 81.0000 mg | DELAYED_RELEASE_TABLET | Freq: Every day | ORAL | Status: DC

## 2016-07-25 MED ORDER — ACETAMINOPHEN 500 MG PO TABS: 500.0000 mg | ORAL_TABLET | ORAL | Status: DC | PRN

## 2016-07-25 NOTE — Discharge Instructions (Signed)
Take your usual prescriptions as previously directed. Walk with your walker. Take your blood pressure once per day, several times per week, either in the morning after you wake up or in the evening before you go to bed.  Always sit quietly for at least 15 minutes before taking your blood pressure.  Keep a diary of your blood pressures to show your doctor at your follow up office visit.  Call your regular medical doctor tomorrow morning to schedule a follow up appointment within the next 2 days.  Return to the Emergency Department immediately sooner if worsening.     Deconditioning Deconditioning refers to the changes in your body that occur during a period of inactivity. The changes happen in your heart, lungs, and muscles. They decrease your ability to be active, and they make you feel tired and weak. There are three stages of deconditioning:  Mild deconditioning. At this stage, you will notice a change in your ability to do your usual exercise activities, such as running, biking, or swimming.  Moderate deconditioning. At this stage, you will notice a change in your ability to do normal everyday activities, such as walking, grocery shopping, and doing chores.  Severe deconditioning. At this stage, you will notice a change in your ability to do minimal activity or normal self-care. Deconditioning can occur after only a few days of inactivity. The longer the period of inactivity, the more severe the deconditioning will be, and the longer it will take to return to your previous level of functioning. What are the causes? Deconditioning is often caused by inactivity due to:  Illnesses, such as cancer, stroke, heart attack, fibromyalgia, and chronic fatigue syndrome.  Injuries, especially back injuries, broken bones, and ligament and tendon injuries.  A long stay in the hospital.  Pregnancy, especially if long periods of bed rest are needed. What increases the risk? This condition is more likely  to develop in:  People who are hospitalized.  People on bed rest.  People who are obese.  People with poor nutrition.  Elderly adults.  People with injuries or illnesses that interfere with movement and activity. What are the signs or symptoms? Symptoms of deconditioning include:  Weakness.  Tiredness.  Shortness of breath with minor exertion.  A faster-than-normal heartbeat. You may not notice this without taking your pulse.  Pain or discomfort with activity.  Decreased strength.  Decreased sense of balance.  Decreased endurance.  Difficulty doing your usual forms of exercise.  Difficulty doing activities of daily living, such as grocery shopping or chores.  Difficulty walking around the house and doing basic self-care, such as getting to the bathroom, preparing meals, or doing laundry. How is this diagnosed? Deconditioning is diagnosed based on your medical history and a physical exam. During the physical exam, your health care provider will check for signs of deconditioning, such as:  Decreased size of muscles.  Decreased strength.  Trouble with balance.  Shortness of breath or abnormally increased heart rate after minor exertion. How is this treated? Treatment for deconditioning usually involves following a structured exercise program in which activity is increased gradually. Your health care provider will determine which exercises are right for you. The exercise program will likely include aerobic exercise and strength training:  Aerobic exercise helps improve the functioning of the heart and lungs as well as the muscles.  Strength training helps improve muscle size and strength. Both of these types of exercise will improve your endurance. You may be referred to a physical therapist  who can create a safe strengthening program for you to follow. Follow these instructions at home:  Follow the exercise program that is recommended by your health care provider  or physical therapist.  Do not increase your exercise any faster than directed.  Eat a healthy diet.  Do not use any products that contain nicotine or tobacco, such as cigarettes and e-cigarettes. If you need help quitting, ask your health care provider.  Take over-the-counter and prescription medicines only as told by your health care provider.  Keep all follow-up visits as told by your health care provider. This is important. Contact a health care provider if:  You are not able to carry out the prescribed exercise program.  You are becoming more and more fatigued and weak.  You become light-headed when rising to a sitting or standing position.  Your level of endurance decreases after it has improved. Get help right away if:  You have chest pain.  You are very short of breath.  You have any episodes of passing out. This information is not intended to replace advice given to you by your health care provider. Make sure you discuss any questions you have with your health care provider. Document Released: 10/21/2013 Document Revised: 12/25/2015 Document Reviewed: 09/05/2015 Elsevier Interactive Patient Education  2017 [employer]   Weakness Weakness is a lack of strength. You may feel weak all over your body or just in one part of your body. Weakness can be serious. In some cases, you may need more medical tests. HOME [place] a well-balanced diet.  Try to exercise every day.  Only take medicines as told by your doctor. GET HELP RIGHT AWAY IF:   You cannot do your normal daily activities.  You cannot walk up and down stairs, or you feel very tired when you do so.  You have shortness of breath or chest pain.  You have trouble moving parts of your body.  You have weakness in only one body part or on only one side of the body.  You have a fever.  You have trouble speaking or swallowing.  You cannot control when you pee (urinate) or poop (bowel  movement).  You have black or bloody throw up (vomit) or poop.  Your weakness gets worse or spreads to other body parts.  You have new aches or pains. MAKE SURE YOU:   Understand these instructions.  Will watch your condition.  Will get help right away if you are not doing well or get worse. This information is not intended to replace advice given to you by your health care provider. Make sure you discuss any questions you have with your health care provider. Document Released: 05/19/2008 Document Revised: 12/06/2011 Document Reviewed: 03/27/2015 Elsevier Interactive Patient Education  2017 [employer].

## 2016-07-25 NOTE — Discharge Summary (Signed)
Physician Discharge Summary  [name] [id number] DOB: 1924/01/11 DOA: 07/24/2016  PCP: Sharon Kilts, MD  Admit date: 07/24/2016 Discharge date: 07/25/2016  Admitted From: Home  Disposition: Home with HHPT and 24/7 supervision for 1 week  Recommendations for Outpatient Follow-up:  1. Follow up with PCP in 1 weeks 2. Please obtain BMP/CBC in 1-2 weeks  Home Health: YES PT  Discharge Condition: STABLE  CODE STATUS: DNR  Brief/Interim Summary: HPI: Sharon Mason is an 83 y.o. female with benign PMH on only low dose Xanax for anxiety, lives alone with close family support, was "over doing her cleaning" today, and felt weak and had trouble getting up.  She denied fever, chills, CP, nausea, vomiting, black or bloody stool.  Work up in the ER showed normal WBC, normal renal Fx, and normal Hb.  Head CT was negative, CXR was clear, LS spine film showed no Fx, EKG was unremarkable, and troponin was 0.03.  She was going to be discharged but her 3 hours Istat troponin rose to 0.06.  So hospitalist was asked to admit her for "cardiac r/out".       Generalized Weakness:  Likely from over cleaning and mild dehydration.   She was able to walk to the BR tonight without any problem. PT has evaluated and recommended HHPT and continue using walker and 24/7 monitoring at home for 1 week, Daughter will be doing this.   Elevated troponins:  Cycled troponin and they have trended down, and there was no significant rise.  If her troponins are flat, should d/c her home.  Mild Dehydration:  Pt was encouraged to drink well at home and daughter will be assisting her as well.  Follow up with PCP.  DVT prophylaxis: Lovenox.  Code Status: DNR.  Confirmed with her and her daughter at bedside.  Family Communication:  daughter.  Disposition Plan: To home once r/out.  Consults called: PT  Admission status: OBS  Discharge Diagnoses:  Active Problems:   Elevated troponin   Mild dehydration   Generalized  weakness    Discharge Instructions  Discharge Instructions    Increase activity slowly    Complete by:  As directed      Allergies as of 07/25/2016   No Known Allergies     Medication List    TAKE these medications   acetaminophen 500 MG tablet Commonly known as:  TYLENOL Take 500 mg by mouth every 6 (six) hours as needed.   ALPRAZolam 0.25 MG tablet Commonly known as:  XANAX Take 0.25 mg by mouth 3 (three) times daily as needed for anxiety.   naproxen sodium 220 MG tablet Commonly known as:  ANAPROX Take 220 mg by mouth 2 (two) times daily with a meal.   PREPARATION H 0.25-88.44 % suppository Generic drug:  shark liver oil-cocoa butter Place 1 suppository rectally as needed for hemorrhoids.      Follow-up Information    Sharon Kilts, MD. Schedule an appointment as soon as possible for a visit in 2 day(s).   Specialty:  Family Medicine Contact information: [address] [place] [id number] [phone]          No Known Allergies  Procedures/Studies: Dg Chest 1 View  Result Date: 07/24/2016 CLINICAL DATA:  Fall EXAM: CHEST 1 VIEW COMPARISON:  04/22/2015 FINDINGS: Hyperinflation. No acute consolidation or effusion. No pneumothorax. Mild cardiomegaly. Atherosclerosis of the aorta. IMPRESSION: Hyperinflation. No acute infiltrate, pneumothorax or pleural effusion. Mild cardiomegaly. Electronically Signed   By: Maudie Mercury  Francoise Ceo M.D.   On: 07/24/2016 20:26   Dg Lumbar Spine Complete  Result Date: 07/24/2016 CLINICAL DATA:  Fall, weakness in the knees EXAM: LUMBAR SPINE - COMPLETE 4+ VIEW COMPARISON:  None. FINDINGS: Alignment is grossly within normal limits. Vertebral body heights are grossly maintained. Degenerative changes at L3-L4, L4-L5 and L5-S1. Atherosclerosis of the aorta. IMPRESSION: Degenerative changes.  No definite acute osseous abnormality. Electronically Signed   By: Donavan Foil M.D.   On: 07/24/2016 20:27   Ct Head Wo Contrast  Result  Date: 07/24/2016 CLINICAL DATA:  Fall in kitchen.  Weakness. Initial encounter. EXAM: CT HEAD WITHOUT CONTRAST TECHNIQUE: Contiguous axial images were obtained from the base of the skull through the vertex without intravenous contrast. COMPARISON:  04/22/2015 FINDINGS: Brain: No evidence of acute infarction, hemorrhage, hydrocephalus, extra-axial collection or mass lesion/mass effect. Moderate chronic microvascular disease and atrophy, stable from 2016. Vascular: Atherosclerotic calcification. Skull: Negative for fracture Sinuses/Orbits: Postoperative globes. Mild chronic sinusitis. No acute finding. IMPRESSION: No evidence of intracranial injury or fracture. Electronically Signed   By: Monte Fantasia M.D.   On: 07/24/2016 20:36     Subjective: Pt really wants to go home, she did not sleep well and is not having chest pain or shortness of breath.    Discharge Exam: Vitals:   07/25/16 0600 07/25/16 0723  BP:    Pulse:    Resp: (!) 27 18  Temp:  98.1 F (36.7 C)   Vitals:   07/25/16 0400 07/25/16 0500 07/25/16 0600 07/25/16 0723  BP:      Pulse:      Resp: 15 13 (!) 27 18  Temp:    98.1 F (36.7 C)  TempSrc:    Oral  SpO2: 96% 95% 95% 97%  Weight:      Height:       General: Pt is alert, awake, not in acute distress, MM dry.  Cardiovascular:  S1/S2 +, no rubs, no gallops Respiratory: CTA bilaterally, no wheezing, no rhonchi Abdominal: Soft, NT, ND, bowel sounds + Extremities: no edema, no cyanosis  The results of significant diagnostics from this hospitalization (including imaging, microbiology, ancillary and laboratory) are listed below for reference.     Microbiology: Recent Results (from the past 240 hour(s))  MRSA PCR Screening     Status: None   Collection Time: 07/25/16  1:30 AM  Result Value Ref Range Status   MRSA by PCR NEGATIVE NEGATIVE Final    Comment:        The GeneXpert MRSA Assay (FDA approved for NASAL specimens only), is one component of a comprehensive  MRSA colonization surveillance program. It is not intended to diagnose MRSA infection nor to guide or monitor treatment for MRSA infections.      Labs: BNP (last 3 results) No results for input(s): BNP in the last 8760 hours. Basic Metabolic Panel:  Recent Labs Lab 07/24/16 1923  NA 140  K 4.3  CL 100*  CO2 31  GLUCOSE 107*  BUN 23*  CREATININE 1.07*  CALCIUM 10.3   Liver Function Tests:  Recent Labs Lab 07/24/16 1923  AST 26  ALT 22  ALKPHOS 58  BILITOT 0.6  PROT 7.2  ALBUMIN 4.1   No results for input(s): LIPASE, AMYLASE in the last 168 hours. No results for input(s): AMMONIA in the last 168 hours. CBC:  Recent Labs Lab 07/24/16 1923  WBC 9.1  NEUTROABS 7.8*  HGB 13.1  HCT 40.7  MCV 98.8  PLT 230  Cardiac Enzymes:  Recent Labs Lab 07/24/16 1923 07/24/16 2122 07/25/16 0225 07/25/16 0643  TROPONINI 0.03* 0.06* 0.05* 0.04*   BNP: Invalid input(s): POCBNP CBG:  Recent Labs Lab 07/24/16 1841  GLUCAP 114*   D-Dimer No results for input(s): DDIMER in the last 72 hours. Hgb A1c No results for input(s): HGBA1C in the last 72 hours. Lipid Profile No results for input(s): CHOL, HDL, LDLCALC, TRIG, CHOLHDL, LDLDIRECT in the last 72 hours. Thyroid function studies  Recent Labs  07/25/16 0225  TSH 2.413   Anemia work up No results for input(s): VITAMINB12, FOLATE, FERRITIN, TIBC, IRON, RETICCTPCT in the last 72 hours. Urinalysis    Component Value Date/Time   COLORURINE YELLOW 07/24/2016 2019   APPEARANCEUR CLEAR 07/24/2016 2019   LABSPEC 1.009 07/24/2016 2019   PHURINE 7.0 07/24/2016 2019   GLUCOSEU NEGATIVE 07/24/2016 2019   HGBUR NEGATIVE 07/24/2016 2019   BILIRUBINUR NEGATIVE 07/24/2016 2019   KETONESUR NEGATIVE 07/24/2016 2019   PROTEINUR NEGATIVE 07/24/2016 2019   NITRITE NEGATIVE 07/24/2016 2019   LEUKOCYTESUR TRACE (A) 07/24/2016 2019   Sepsis Labs Invalid input(s): PROCALCITONIN,  WBC,   LACTICIDVEN Microbiology Recent Results (from the past 240 hour(s))  MRSA PCR Screening     Status: None   Collection Time: 07/25/16  1:30 AM  Result Value Ref Range Status   MRSA by PCR NEGATIVE NEGATIVE Final    Comment:        The GeneXpert MRSA Assay (FDA approved for NASAL specimens only), is one component of a comprehensive MRSA colonization surveillance program. It is not intended to diagnose MRSA infection nor to guide or monitor treatment for MRSA infections.    Time coordinating discharge: Over 30 minutes  SIGNED:  Irwin Brakeman, MD  [facility] 07/25/2016, 9:22 AM Pager   If 7PM-7AM, please contact night-coverage [email or web address] Password [email or web address]

## 2016-07-25 NOTE — Care Management Note (Signed)
Case Management Note  Patient Details  Name: Sharon Mason MRN: [id number] Date of Birth: [birth year]     Expected Discharge Date:  07/25/16               Expected Discharge Plan:  [place]  In-House Referral:     Discharge planning Services  CM Consult  Post Acute Care Choice:  Home Health Choice offered to:  Patient, Adult Children  DME Arranged:    DME Agency:     HH Arranged:  PT [place]:  [facility] (now [facility])  Status of Service:  Completed, signed off  If discussed at [employer] of Stay Meetings, dates discussed:    Additional Comments: Patient discharged home. Spoke with patient and wife prior to her leaving, she is agreeable to [facility]. Offered choice of agencies. Tim of [facility] home [facility] notified and will obtain orders from chart. Daughter states she will be staying with patient for a while. Patient has RW already at home. No other CM needs. Laiyah Exline, Chauncey Reading, RN 07/25/2016, 10:39 AM

## 2016-07-25 NOTE — ED Notes (Signed)
Pt was given xanax 0.25mg  by family, stated it was ok w/ admitting MD.

## 2016-07-25 NOTE — Evaluation (Signed)
Physical Therapy Evaluation Patient Details Name: Sharon Mason MRN: [id number] DOB: 21-Sep-1923 Today's Date: 07/25/2016   History of Present Illness  83 y.o. female with benign PMH on only low dose Xanax for anxiety, lives alone with close family support, was "over doing her cleaning" today, and felt weak and had trouble getting up.  She denied fever, chills, CP, nausea, vomiting, black or bloody stool.  Work up in the ER showed normal WBC, normal renal Fx, and normal Hb.  Head CT was negative, CXR was clear, LS spine film showed no Fx, EKG was unremarkable, and troponin was 0.03.  She was going to be discharged but her 3 hours Istat troponin rose to 0.06.Marland Kitchen  Now back down to 0.04.    Clinical Impression  Pt received in bed, dtr present, and pt is agreeable to PT evaluation.  Pt is normally independent with ambulation, however, she will occasionally use her RW when she is feeling weak.  She is independent with dressing and bathing, and although she does not drive, she normally goes with her dtr to run errands.  She has not been doing this for the past few weeks due to the weather being cold and illnesses going around.  During PT evaluation, she was able to ambulate 270f with min guard and no device, however she was unsteady and would likely benefit from a RW.  She also c/o feeling dizzy during ambulation.  Vitals during evaluation are as follows:  Resting BP: 135/57, HR: 69bpm After ambulation BP: 162/64, and HR: at peak during ambualtion was 106bpm Pt is recommended for HHPT, 24/7 supervision/assistance, and to use her RW for ambulation at home.      Follow Up Recommendations Home health PT;Supervision/Assistance - 24 hour (Recommend that pt use her RW util HHPT advises otherwise. )    Equipment Recommendations  None recommended by PT    Recommendations for Other Services       Precautions / Restrictions Precautions Precautions: Fall Precaution Comments: 1 fall  yesterday Restrictions Weight Bearing Restrictions: No      Mobility  Bed Mobility Overal bed mobility: Modified Independent                Transfers Overall transfer level: Needs assistance Equipment used: None Transfers: Sit to/from Stand Sit to Stand: Min guard            Ambulation/Gait Ambulation/Gait assistance: Min guard Ambulation Distance (Feet): 200 Feet Assistive device: None Gait Pattern/deviations: Ataxic;Step-through pattern   Gait velocity interpretation: <1.8 ft/sec, indicative of risk for recurrent falls General Gait Details: Pt demonstrates unsteady gait, and would likely benefit from a RW.  Pt also expresses feeling "dizzy" and states it is more of an unsteady feeling than lightheaded or spinning.    Stairs            Wheelchair Mobility    Modified Rankin (Stroke Patients Only)       Balance Overall balance assessment: History of Falls;Needs assistance Sitting-balance support: Bilateral upper extremity supported;Feet supported Sitting balance-Leahy Scale: Good     Standing balance support: No upper extremity supported Standing balance-Leahy Scale: Fair                               Pertinent Vitals/Pain Pain Assessment: 0-10 Pain Location: soreness Pain Descriptors / Indicators: Discomfort Pain Intervention(s): Limited activity within patient's tolerance;Monitored during session;Repositioned    Home Living   Living Arrangements: Alone Available Help  at Discharge: Family;Available PRN/intermittently (dtr - daily checks) Type of Home: House Home Access: Stairs to enter;Ramped entrance   Entrance Stairs-Number of Steps: 1-2 Home Layout: Able to live on main level with bedroom/bathroom;Laundry or work area in [place]: Environmental consultant - 2 wheels;Bedside commode      Prior Function     Gait / Transfers Assistance Needed: independent with ambulation mostly.  Occasional use of the RW when she is feeling  weak.    ADL's / Homemaking Assistance Needed: pt states she is independent with dressing and bathing.  She does not drive.  Dtr. takes her to run errands.  Pt has not gone with her for the past few weeks due to cold and illnesses going around.  Other than that, she normally will go with her dtr to run errands.          Hand Dominance        Extremity/Trunk Assessment   Upper Extremity Assessment Upper Extremity Assessment: Generalized weakness    Lower Extremity Assessment Lower Extremity Assessment: Generalized weakness       Communication   Communication: No difficulties  Cognition Arousal/Alertness: Awake/alert Behavior During Therapy: WFL for tasks assessed/performed Overall Cognitive Status: Impaired/Different from baseline Area of Impairment: Orientation Orientation Level: Time             General Comments: year - not able to stae    General Comments      Exercises     Assessment/Plan    PT Assessment All further PT needs can be met in the next venue of care  PT Problem List Decreased strength;Decreased activity tolerance;Decreased balance;Decreased mobility          PT Treatment Interventions Gait training;Functional mobility training;Therapeutic activities;DME instruction;Therapeutic exercise;Patient/family education    PT Goals (Current goals can be found in the Care Plan section)  Acute Rehab PT Goals PT Goal Formulation: All assessment and education complete, DC therapy    Frequency     Barriers to discharge Decreased caregiver support Pt lives alone, but dtr states she will be able to stay with her for a little while.     Co-evaluation               End of Session Equipment Utilized During Treatment: Gait belt Activity Tolerance: Patient tolerated treatment well Patient left: in bed;with call bell/phone within reach;with family/visitor present Nurse Communication: Mobility status Evelena Peat, RN notified of pt's mobility status.   Mobility sheet left hanging in the room.  )    Functional Assessment Tool Used: [employer]	[place] AM-PAC "6-clicks"  Functional Limitation: Mobility: Walking and moving around Mobility: Walking and Moving Around Current Status [phone]): At least 20 percent but less than 40 percent impaired, limited or restricted Mobility: Walking and Moving Around Goal Status [phone]): At least 1 percent but less than 20 percent impaired, limited or restricted    Time: 0842-0910 PT Time Calculation (min) (ACUTE ONLY): 28 min   Charges:   PT Evaluation $PT Eval Low Complexity: 1 Procedure PT Treatments $Gait Training: 8-22 mins   PT G Codes:   PT G-Codes **NOT FOR INPATIENT CLASS** Functional Assessment Tool Used: [employer] "6-clicks"  Functional Limitation: Mobility: Walking and moving around Mobility: Walking and Moving Around Current Status [phone]): At least 20 percent but less than 40 percent impaired, limited or restricted Mobility: Walking and Moving Around Goal Status [phone]): At least 1 percent but less than 20 percent impaired, limited or restricted   Eustaquio Maize Breanna Shorkey, PT, DPT  X: [phone]

## 2016-07-26 LAB — URINE CULTURE

## 2016-08-25 DIAGNOSIS — Z1389 Encounter for screening for other disorder: Secondary | ICD-10-CM | POA: Diagnosis not present

## 2016-08-25 DIAGNOSIS — Z681 Body mass index (BMI) 19 or less, adult: Secondary | ICD-10-CM | POA: Diagnosis not present

## 2016-08-25 DIAGNOSIS — E782 Mixed hyperlipidemia: Secondary | ICD-10-CM | POA: Diagnosis not present

## 2016-08-25 DIAGNOSIS — M1991 Primary osteoarthritis, unspecified site: Secondary | ICD-10-CM | POA: Diagnosis not present

## 2016-08-25 DIAGNOSIS — R5383 Other fatigue: Secondary | ICD-10-CM | POA: Diagnosis not present

## 2016-08-25 DIAGNOSIS — I10 Essential (primary) hypertension: Secondary | ICD-10-CM | POA: Diagnosis not present

## 2016-12-01 ENCOUNTER — Other Ambulatory Visit: Payer: Self-pay

## 2016-12-01 ENCOUNTER — Encounter (HOSPITAL_COMMUNITY): Payer: Self-pay | Admitting: Emergency Medicine

## 2016-12-01 ENCOUNTER — Emergency Department (HOSPITAL_COMMUNITY): Payer: Medicare Other

## 2016-12-01 ENCOUNTER — Emergency Department: Payer: Medicare Other

## 2016-12-01 ENCOUNTER — Emergency Department (HOSPITAL_COMMUNITY)
Admission: EM | Admit: 2016-12-01 | Discharge: 2016-12-01 | Disposition: A | Discharge status: Home / Self Care | Payer: Medicare Other | Attending: Emergency Medicine | Admitting: Emergency Medicine

## 2016-12-01 DIAGNOSIS — Z79899 Other long term (current) drug therapy: Secondary | ICD-10-CM | POA: Insufficient documentation

## 2016-12-01 DIAGNOSIS — S0101XA Laceration without foreign body of scalp, initial encounter: Secondary | ICD-10-CM | POA: Diagnosis not present

## 2016-12-01 DIAGNOSIS — I10 Essential (primary) hypertension: Secondary | ICD-10-CM | POA: Diagnosis not present

## 2016-12-01 DIAGNOSIS — S022XXA Fracture of nasal bones, initial encounter for closed fracture: Secondary | ICD-10-CM | POA: Insufficient documentation

## 2016-12-01 DIAGNOSIS — W1809XA Striking against other object with subsequent fall, initial encounter: Secondary | ICD-10-CM | POA: Insufficient documentation

## 2016-12-01 DIAGNOSIS — Y939 Activity, unspecified: Secondary | ICD-10-CM | POA: Diagnosis not present

## 2016-12-01 DIAGNOSIS — R079 Chest pain, unspecified: Secondary | ICD-10-CM | POA: Diagnosis not present

## 2016-12-01 DIAGNOSIS — S0993XA Unspecified injury of face, initial encounter: Secondary | ICD-10-CM | POA: Diagnosis not present

## 2016-12-01 DIAGNOSIS — Y999 Unspecified external cause status: Secondary | ICD-10-CM | POA: Diagnosis not present

## 2016-12-01 DIAGNOSIS — R404 Transient alteration of awareness: Secondary | ICD-10-CM | POA: Diagnosis not present

## 2016-12-01 DIAGNOSIS — R42 Dizziness and giddiness: Secondary | ICD-10-CM | POA: Diagnosis not present

## 2016-12-01 DIAGNOSIS — Y929 Unspecified place or not applicable: Secondary | ICD-10-CM | POA: Diagnosis not present

## 2016-12-01 DIAGNOSIS — S0181XA Laceration without foreign body of other part of head, initial encounter: Secondary | ICD-10-CM | POA: Diagnosis not present

## 2016-12-01 DIAGNOSIS — W19XXXA Unspecified fall, initial encounter: Secondary | ICD-10-CM

## 2016-12-01 DIAGNOSIS — S0992XA Unspecified injury of nose, initial encounter: Secondary | ICD-10-CM | POA: Diagnosis present

## 2016-12-01 DIAGNOSIS — S0990XA Unspecified injury of head, initial encounter: Secondary | ICD-10-CM | POA: Diagnosis not present

## 2016-12-01 LAB — URINALYSIS, ROUTINE W REFLEX MICROSCOPIC
BILIRUBIN URINE: NEGATIVE
GLUCOSE, UA: NEGATIVE mg/dL
HGB URINE DIPSTICK: NEGATIVE
KETONES UR: NEGATIVE mg/dL
Leukocytes, UA: NEGATIVE
Nitrite: NEGATIVE
PROTEIN: NEGATIVE mg/dL
Specific Gravity, Urine: 1.004 — ABNORMAL LOW (ref 1.005–1.030)
pH: 7 (ref 5.0–8.0)

## 2016-12-01 LAB — CBC WITH DIFFERENTIAL/PLATELET
BASOS PCT: 0 %
Basophils Absolute: 0 10*3/uL (ref 0.0–0.1)
EOS ABS: 0.1 10*3/uL (ref 0.0–0.7)
EOS PCT: 1 %
HCT: 40.6 % (ref 36.0–46.0)
Hemoglobin: 13.1 g/dL (ref 12.0–15.0)
LYMPHS ABS: 1.2 10*3/uL (ref 0.7–4.0)
Lymphocytes Relative: 14 %
MCH: 32 pg (ref 26.0–34.0)
MCHC: 32.3 g/dL (ref 30.0–36.0)
MCV: 99 fL (ref 78.0–100.0)
MONOS PCT: 5 %
Monocytes Absolute: 0.5 10*3/uL (ref 0.1–1.0)
Neutro Abs: 6.7 10*3/uL (ref 1.7–7.7)
Neutrophils Relative %: 80 %
PLATELETS: 239 10*3/uL (ref 150–400)
RBC: 4.1 MIL/uL (ref 3.87–5.11)
RDW: 13 % (ref 11.5–15.5)
WBC: 8.5 10*3/uL (ref 4.0–10.5)

## 2016-12-01 LAB — COMPREHENSIVE METABOLIC PANEL
ALK PHOS: 57 U/L (ref 38–126)
ALT: 18 U/L (ref 14–54)
AST: 21 U/L (ref 15–41)
Albumin: 3.9 g/dL (ref 3.5–5.0)
Anion gap: 9 (ref 5–15)
BUN: 15 mg/dL (ref 6–20)
CALCIUM: 9.9 mg/dL (ref 8.9–10.3)
CO2: 29 mmol/L (ref 22–32)
CREATININE: 0.79 mg/dL (ref 0.44–1.00)
Chloride: 103 mmol/L (ref 101–111)
Glucose, Bld: 93 mg/dL (ref 65–99)
Potassium: 3.6 mmol/L (ref 3.5–5.1)
SODIUM: 141 mmol/L (ref 135–145)
Total Bilirubin: 0.8 mg/dL (ref 0.3–1.2)
Total Protein: 7.1 g/dL (ref 6.5–8.1)

## 2016-12-01 LAB — TROPONIN I: TROPONIN I: 0.03 ng/mL — AB (ref <0.03)

## 2016-12-01 MED ORDER — POVIDONE-IODINE 10 % EX SOLN
CUTANEOUS | Status: AC
  Filled 2016-12-01: qty 118

## 2016-12-01 MED ORDER — HYDROGEN PEROXIDE 3 % EX SOLN
CUTANEOUS | Status: AC
  Administered 2016-12-01: 1
  Filled 2016-12-01: qty 473

## 2016-12-01 MED ORDER — LIDOCAINE HCL (PF) 1 % IJ SOLN
5.0000 mL | Freq: Once | INTRAMUSCULAR | Status: AC
  Administered 2016-12-01: 5 mL
  Filled 2016-12-01: qty 5

## 2016-12-01 MED ORDER — DOUBLE ANTIBIOTIC 500-10000 UNIT/GM EX OINT
TOPICAL_OINTMENT | Freq: Once | CUTANEOUS | Status: DC
  Filled 2016-12-01: qty 1

## 2016-12-01 MED ORDER — ACETAMINOPHEN 325 MG PO TABS
650.0000 mg | ORAL_TABLET | Freq: Once | ORAL | Status: AC
  Administered 2016-12-01: 650 mg via ORAL
  Filled 2016-12-01: qty 2

## 2016-12-01 NOTE — ED Provider Notes (Signed)
[facility] DEPT Provider Note   CSN: [id number] Arrival date & time: 12/01/16  6606     History   Chief Complaint Chief Complaint  Patient presents with  . Fall    HPI Sharon Mason is a 83 y.o. female.  HPI Patient presents after a fall. Reportedly got up in the middle night and lost her balance. Reportedly hit the right side of her head. Patient states she's been feeling weak since yesterday. States she just feels bad. No chest pain. No confusion. No dysuria. She has had previous falls. Patient now states that she hurts all over.   Past Medical History:  Diagnosis Date  . Anxiety   . Headache   . Hypertension     Patient Active Problem List   Diagnosis Date Noted  . Mild dehydration 07/25/2016  . Generalized weakness 07/25/2016  . Elevated troponin 07/24/2016    Past Surgical History:  Procedure Laterality Date  . EYE SURGERY    . SKIN BIOPSY    . skin cancer removal      OB History    Gravida Para Term Preterm AB Living             1   SAB TAB Ectopic Multiple Live Births                   Home Medications    Prior to Admission medications   Medication Sig Start Date End Date Taking? Authorizing Provider  acetaminophen (TYLENOL) 500 MG tablet Take 1,000 mg by mouth every 6 (six) hours as needed.    Yes [provider]  ALPRAZolam (XANAX) 0.25 MG tablet Take 0.25 mg by mouth 3 (three) times daily as needed for anxiety.   Yes [provider]  naproxen sodium (ANAPROX) 220 MG tablet Take 220 mg by mouth 2 (two) times daily with a meal.   Yes [provider]  shark liver oil-cocoa butter (PREPARATION H) 0.25-88.44 % suppository Place 1 suppository rectally as needed for hemorrhoids.   Yes [provider]    Family History History reviewed. No pertinent family history.  Social History Social History  Substance Use Topics  . Smoking status: Never Smoker  . Smokeless tobacco: Never Used  . Alcohol use No      Allergies   Patient has no known allergies.   Review of Systems Review of Systems  Constitutional: Positive for fatigue. Negative for appetite change.  HENT: Negative for congestion.   Respiratory: Negative for shortness of breath.   Cardiovascular: Negative for chest pain.  Gastrointestinal: Negative for abdominal pain.  Musculoskeletal: Negative for back pain.  Skin: Negative for rash.  Neurological: Positive for weakness and headaches.  Hematological: Negative for adenopathy.     Physical Exam Updated Vital Signs BP (!) 181/68   Pulse 72   Temp 98 F (36.7 C) (Oral)   Resp 16   Ht 5' (1.524 m)   Wt 36.7 kg (81 lb)   SpO2 96%   BMI 15.82 kg/m   Physical Exam  Constitutional: She appears well-developed.  HENT:  Approximate 4 cm curved laceration to right forehead with flap. Some periorbital ecchymosis. Eye movements intact. Face is stable but has swelling of bridge of nose. Separate 3.5 cm straight laceration on top of scalp.  Eyes: EOM are normal.  Neck: Neck supple.  No midline cervical tenderness.  Cardiovascular: Normal rate.   Pulmonary/Chest: Effort normal.  Abdominal: There is no tenderness.  Musculoskeletal: She exhibits no edema.  Mild ecchymosis to right medial forearm. No underlying bony tenderness.  Neurological: She is alert.  Skin: Skin is warm. Capillary refill takes less than 2 seconds.  Psychiatric: She has a normal mood and affect.     ED Treatments / Results  Labs (all labs ordered are listed, but only abnormal results are displayed) Labs Reviewed  TROPONIN I - Abnormal; Notable for the following:       Result Value   Troponin I 0.03 (*)    All other components within normal limits  URINALYSIS, ROUTINE W REFLEX MICROSCOPIC - Abnormal; Notable for the following:    Color, Urine STRAW (*)    Specific Gravity, Urine 1.004 (*)    All other components within normal limits  COMPREHENSIVE METABOLIC PANEL  CBC WITH  DIFFERENTIAL/PLATELET    EKG  EKG Interpretation None       Radiology Dg Chest 2 View  Result Date: 12/01/2016 CLINICAL DATA:  Chest pain after a fall.  Initial encounter. EXAM: CHEST  2 VIEW COMPARISON:  07/24/2016 FINDINGS: The cardiac silhouette remains mildly enlarged. Aortic atherosclerosis is noted. The lungs remain hyperinflated without evidence of airspace consolidation, edema, pleural effusion, or pneumothorax. The bones are osteopenic. No acute osseous abnormality is identified. IMPRESSION: No active cardiopulmonary disease. Hyperinflation and mild cardiomegaly. Electronically Signed   By: Logan Bores M.D.   On: 12/01/2016 11:07   Ct Head Wo Contrast  Result Date: 12/01/2016 CLINICAL DATA:  Status post fall last night with a blow to the left side of the head. Laceration. EXAM: CT HEAD WITHOUT CONTRAST CT MAXILLOFACIAL WITHOUT CONTRAST TECHNIQUE: Multidetector CT imaging of the head and maxillofacial structures were performed using the standard protocol without intravenous contrast. Multiplanar CT image reconstructions of the maxillofacial structures were also generated. COMPARISON:  Head CT scan 07/24/2016. FINDINGS: CT HEAD FINDINGS Brain: Cortical atrophy and chronic microvascular ischemic change are seen. No evidence of acute abnormality including hemorrhage, infarct, mass lesion, mass effect, midline shift or abnormal extra-axial fluid collection. No hydrocephalus or pneumocephalus. Vascular: Atherosclerosis is noted. Skull: Intact. Laceration over the right side of the forehead is noted. No underlying fracture or radiopaque foreign body. Other: None. CT MAXILLOFACIAL FINDINGS Osseous: There are acute bilateral nasal bone fractures with mild lateral displacement of the right nasal bone identified. No other fracture is seen. The mandibular condyles are normally located. There is partial visualization of degenerative disease in the upper cervical spine. Orbits: The globes are intact and  orbital fat is clear. The patient is status post bilateral lens extraction. Sinuses: There is mucosal thickening in the left maxillary sinus with calcifications present most consistent with chronic change. Soft tissues: Soft tissue swelling about the nose. Carotid atherosclerosis noted. IMPRESSION: Bilateral nasal bone fractures with mild lateral displacement of the right nasal bone. Laceration of the right forehead without underlying fracture or foreign body. No acute intracranial abnormality. Atrophy and chronic microvascular ischemic change. Partial visualization of cervical spondylosis. Findings consistent chronic left maxillary sinus disease. Electronically Signed   By: Inge Rise M.D.   On: 12/01/2016 11:27   Ct Maxillofacial Wo Contrast  Result Date: 12/01/2016 CLINICAL DATA:  Status post fall last night with a blow to the left side of the head. Laceration. EXAM: CT HEAD WITHOUT CONTRAST CT MAXILLOFACIAL WITHOUT CONTRAST TECHNIQUE: Multidetector CT imaging of the head and maxillofacial structures were performed using the standard protocol without intravenous contrast. Multiplanar CT image reconstructions of the maxillofacial structures were also generated. COMPARISON:  Head CT scan 07/24/2016. FINDINGS:  CT HEAD FINDINGS Brain: Cortical atrophy and chronic microvascular ischemic change are seen. No evidence of acute abnormality including hemorrhage, infarct, mass lesion, mass effect, midline shift or abnormal extra-axial fluid collection. No hydrocephalus or pneumocephalus. Vascular: Atherosclerosis is noted. Skull: Intact. Laceration over the right side of the forehead is noted. No underlying fracture or radiopaque foreign body. Other: None. CT MAXILLOFACIAL FINDINGS Osseous: There are acute bilateral nasal bone fractures with mild lateral displacement of the right nasal bone identified. No other fracture is seen. The mandibular condyles are normally located. There is partial visualization of  degenerative disease in the upper cervical spine. Orbits: The globes are intact and orbital fat is clear. The patient is status post bilateral lens extraction. Sinuses: There is mucosal thickening in the left maxillary sinus with calcifications present most consistent with chronic change. Soft tissues: Soft tissue swelling about the nose. Carotid atherosclerosis noted. IMPRESSION: Bilateral nasal bone fractures with mild lateral displacement of the right nasal bone. Laceration of the right forehead without underlying fracture or foreign body. No acute intracranial abnormality. Atrophy and chronic microvascular ischemic change. Partial visualization of cervical spondylosis. Findings consistent chronic left maxillary sinus disease. Electronically Signed   By: Inge Rise M.D.   On: 12/01/2016 11:27    Procedures Procedures (including critical care time)  Medications Ordered in ED Medications  povidone-iodine (BETADINE) 10 % external solution (not administered)  polymixin-bacitracin (POLYSPORIN) ointment (not administered)  hydrogen peroxide 3 % external solution (1 application  Given 5/62/13 1408)  lidocaine (PF) (XYLOCAINE) 1 % injection 5 mL (5 mLs Infiltration Given by Other 12/01/16 1300)  acetaminophen (TYLENOL) tablet 650 mg (650 mg Oral Given 12/01/16 1407)     Initial Impression / Assessment and Plan / ED Course  I have reviewed the triage vital signs and the nursing notes.  Pertinent labs & imaging results that were available during my care of the patient were reviewed by me and considered in my medical decision making (see chart for details).     Patient with fall. Labs reassuring. Head CT shows nasal fractures. No intracranial hemorrhage. Wounds closed. Discharge home to follow-up with PCP. The right forehead wound is somewhat irregular and was difficult closure. Still some exposed denuded skin.  LACERATION REPAIR Performed by: Mackie Pai Authorized by:  Mackie Pai Consent: Verbal consent obtained. Risks and benefits: risks, benefits and alternatives were discussed Consent given by: patient Patient identity confirmed: provided demographic data Prepped and Draped in normal sterile fashion Wound explored  Laceration Location: Forehead  Laceration Length: 5 cm  No Foreign Bodies seen or palpated  Anesthesia: local infiltration  Local anesthetic: lidocaine 1 %   Anesthetic total: 3 ml  Irrigation method: syringe Amount of cleaning: standard  Skin closure: 4-0 and 5-0 Vicryl Rapide   Number of sutures: 12   Technique: Simple interrupted   Patient tolerance: Patient tolerated the procedure well with no immediate complications.  LACERATION REPAIR Performed by: Mackie Pai Authorized by: Mackie Pai Consent: Verbal consent obtained. Risks and benefits: risks, benefits and alternatives were discussed Consent given by: patient Patient identity confirmed: provided demographic data Prepped and Draped in normal sterile fashion Wound explored  Laceration Location: Scalp  Laceration Length: 3.5 cm  No Foreign Bodies seen or palpated  Anesthesia: local infiltration  Local anesthetic: lidocaine 1 % without epinephrine  Anesthetic total: 2 ml  Irrigation method: syringe Amount of cleaning: standard  Skin closure: 5-0 Vicryl Rapide   Number of sutures: 7   Technique: Simple interrupted  Patient tolerance: Patient tolerated the procedure well with no immediate complications.  Final Clinical Impressions(s) / ED Diagnoses   Final diagnoses:  Fall, initial encounter  Laceration of scalp, initial encounter  Closed fracture of nasal bone, initial encounter    New Prescriptions New Prescriptions   No medications on file     Davonna Belling, MD 12/01/16 1413

## 2016-12-01 NOTE — ED Notes (Signed)
CRITICAL VALUE ALERT  Critical Value:  Trop 0.03  Date & Time Notied:  1119, 12/01/16  Provider Notified: Dr. Alvino Chapel  Orders Received/Actions taken: No new orders received.

## 2016-12-01 NOTE — Discharge Instructions (Signed)
Sutures out in 5-7 days. °

## 2016-12-01 NOTE — ED Triage Notes (Signed)
[facility] reports pt fell during the middle of the night and hit the right side of her head either on a dresser or wall with large laceration noted. PT daughter went to check on the pt this am and noted dried blood to scalp and called EMS. PT alert and oriented on arrival to Ed.

## 2016-12-01 NOTE — ED Notes (Signed)
Pt assisted with the bedpan. Urine sample obtained.

## 2016-12-09 DIAGNOSIS — Z681 Body mass index (BMI) 19 or less, adult: Secondary | ICD-10-CM | POA: Diagnosis not present

## 2016-12-09 DIAGNOSIS — D511 Vitamin B12 deficiency anemia due to selective vitamin B12 malabsorption with proteinuria: Secondary | ICD-10-CM | POA: Diagnosis not present

## 2016-12-09 DIAGNOSIS — Z1389 Encounter for screening for other disorder: Secondary | ICD-10-CM | POA: Diagnosis not present

## 2016-12-09 DIAGNOSIS — Z4802 Encounter for removal of sutures: Secondary | ICD-10-CM | POA: Diagnosis not present

## 2016-12-09 DIAGNOSIS — S0101XD Laceration without foreign body of scalp, subsequent encounter: Secondary | ICD-10-CM | POA: Diagnosis not present

## 2017-05-02 DIAGNOSIS — Z681 Body mass index (BMI) 19 or less, adult: Secondary | ICD-10-CM | POA: Diagnosis not present

## 2017-05-02 DIAGNOSIS — F419 Anxiety disorder, unspecified: Secondary | ICD-10-CM | POA: Diagnosis not present

## 2017-06-06 DIAGNOSIS — H5203 Hypermetropia, bilateral: Secondary | ICD-10-CM | POA: Diagnosis not present

## 2017-06-06 DIAGNOSIS — H2703 Aphakia, bilateral: Secondary | ICD-10-CM | POA: Diagnosis not present

## 2017-06-06 DIAGNOSIS — H524 Presbyopia: Secondary | ICD-10-CM | POA: Diagnosis not present

## 2017-06-11 ENCOUNTER — Observation Stay (HOSPITAL_COMMUNITY)
Admission: EM | Admit: 2017-06-11 | Discharge: 2017-06-12 | Disposition: A | Discharge status: Home / Self Care | Payer: Medicare Other | Attending: Internal Medicine | Admitting: Internal Medicine

## 2017-06-11 ENCOUNTER — Encounter (HOSPITAL_COMMUNITY): Payer: Self-pay | Admitting: Emergency Medicine

## 2017-06-11 ENCOUNTER — Emergency Department (HOSPITAL_COMMUNITY): Payer: Medicare Other

## 2017-06-11 ENCOUNTER — Other Ambulatory Visit: Payer: Self-pay

## 2017-06-11 DIAGNOSIS — M6281 Muscle weakness (generalized): Secondary | ICD-10-CM | POA: Diagnosis not present

## 2017-06-11 DIAGNOSIS — I10 Essential (primary) hypertension: Secondary | ICD-10-CM | POA: Diagnosis not present

## 2017-06-11 DIAGNOSIS — W19XXXA Unspecified fall, initial encounter: Secondary | ICD-10-CM | POA: Diagnosis present

## 2017-06-11 DIAGNOSIS — S299XXA Unspecified injury of thorax, initial encounter: Secondary | ICD-10-CM | POA: Diagnosis not present

## 2017-06-11 DIAGNOSIS — E860 Dehydration: Secondary | ICD-10-CM | POA: Diagnosis not present

## 2017-06-11 DIAGNOSIS — I951 Orthostatic hypotension: Secondary | ICD-10-CM

## 2017-06-11 DIAGNOSIS — R531 Weakness: Secondary | ICD-10-CM

## 2017-06-11 DIAGNOSIS — I82401 Acute embolism and thrombosis of unspecified deep veins of right lower extremity: Secondary | ICD-10-CM | POA: Diagnosis present

## 2017-06-11 DIAGNOSIS — Y92009 Unspecified place in unspecified non-institutional (private) residence as the place of occurrence of the external cause: Secondary | ICD-10-CM | POA: Diagnosis not present

## 2017-06-11 DIAGNOSIS — R2681 Unsteadiness on feet: Secondary | ICD-10-CM | POA: Insufficient documentation

## 2017-06-11 DIAGNOSIS — R030 Elevated blood-pressure reading, without diagnosis of hypertension: Secondary | ICD-10-CM | POA: Diagnosis not present

## 2017-06-11 DIAGNOSIS — R609 Edema, unspecified: Secondary | ICD-10-CM

## 2017-06-11 DIAGNOSIS — M25551 Pain in right hip: Secondary | ICD-10-CM | POA: Diagnosis not present

## 2017-06-11 DIAGNOSIS — R2689 Other abnormalities of gait and mobility: Secondary | ICD-10-CM | POA: Diagnosis not present

## 2017-06-11 DIAGNOSIS — W1830XA Fall on same level, unspecified, initial encounter: Secondary | ICD-10-CM | POA: Diagnosis not present

## 2017-06-11 DIAGNOSIS — S0990XA Unspecified injury of head, initial encounter: Secondary | ICD-10-CM | POA: Diagnosis not present

## 2017-06-11 DIAGNOSIS — S79911A Unspecified injury of right hip, initial encounter: Secondary | ICD-10-CM | POA: Diagnosis not present

## 2017-06-11 DIAGNOSIS — S199XXA Unspecified injury of neck, initial encounter: Secondary | ICD-10-CM | POA: Diagnosis not present

## 2017-06-11 LAB — URINALYSIS, ROUTINE W REFLEX MICROSCOPIC
Bilirubin Urine: NEGATIVE
GLUCOSE, UA: NEGATIVE mg/dL
HGB URINE DIPSTICK: NEGATIVE
Ketones, ur: NEGATIVE mg/dL
LEUKOCYTES UA: NEGATIVE
Nitrite: NEGATIVE
PH: 8 (ref 5.0–8.0)
PROTEIN: NEGATIVE mg/dL
SPECIFIC GRAVITY, URINE: 1.004 — AB (ref 1.005–1.030)

## 2017-06-11 LAB — CBC WITH DIFFERENTIAL/PLATELET
BASOS ABS: 0 10*3/uL (ref 0.0–0.1)
Basophils Relative: 0 %
Eosinophils Absolute: 0.1 10*3/uL (ref 0.0–0.7)
Eosinophils Relative: 2 %
HEMATOCRIT: 39.3 % (ref 36.0–46.0)
HEMOGLOBIN: 11.9 g/dL — AB (ref 12.0–15.0)
LYMPHS ABS: 1 10*3/uL (ref 0.7–4.0)
LYMPHS PCT: 12 %
MCH: 30.2 pg (ref 26.0–34.0)
MCHC: 30.3 g/dL (ref 30.0–36.0)
MCV: 99.7 fL (ref 78.0–100.0)
Monocytes Absolute: 0.4 10*3/uL (ref 0.1–1.0)
Monocytes Relative: 5 %
NEUTROS ABS: 6.7 10*3/uL (ref 1.7–7.7)
NEUTROS PCT: 81 %
Platelets: 264 10*3/uL (ref 150–400)
RBC: 3.94 MIL/uL (ref 3.87–5.11)
RDW: 13.1 % (ref 11.5–15.5)
WBC: 8.2 10*3/uL (ref 4.0–10.5)

## 2017-06-11 LAB — COMPREHENSIVE METABOLIC PANEL
ALBUMIN: 3.7 g/dL (ref 3.5–5.0)
ALT: 14 U/L (ref 14–54)
AST: 24 U/L (ref 15–41)
Alkaline Phosphatase: 69 U/L (ref 38–126)
Anion gap: 10 (ref 5–15)
BILIRUBIN TOTAL: 0.6 mg/dL (ref 0.3–1.2)
BUN: 17 mg/dL (ref 6–20)
CHLORIDE: 103 mmol/L (ref 101–111)
CO2: 29 mmol/L (ref 22–32)
Calcium: 9.9 mg/dL (ref 8.9–10.3)
Creatinine, Ser: 0.88 mg/dL (ref 0.44–1.00)
GFR calc Af Amer: >60 mL/min (ref >60)
GFR calc non Af Amer: 56 mL/min — ABNORMAL LOW (ref >60)
GLUCOSE: 86 mg/dL (ref 65–99)
POTASSIUM: 3.7 mmol/L (ref 3.5–5.1)
SODIUM: 142 mmol/L (ref 135–145)
TOTAL PROTEIN: 7 g/dL (ref 6.5–8.1)

## 2017-06-11 LAB — CK: Total CK: 256 U/L — ABNORMAL HIGH (ref 38–234)

## 2017-06-11 LAB — TROPONIN I: Troponin I: 0.03 ng/mL (ref <0.03)

## 2017-06-11 LAB — LACTIC ACID, PLASMA
Lactic Acid, Venous: 0.7 mmol/L (ref 0.5–1.9)
Lactic Acid, Venous: 0.6 mmol/L (ref 0.5–1.9)

## 2017-06-11 MED ORDER — POLYETHYLENE GLYCOL 3350 17 G PO PACK: 17.0000 g | PACK | Freq: Every day | ORAL | Status: DC | PRN

## 2017-06-11 MED ORDER — ONDANSETRON HCL 4 MG/2ML IJ SOLN: 4.0000 mg | Freq: Four times a day (QID) | INTRAMUSCULAR | Status: DC | PRN

## 2017-06-11 MED ORDER — ACETAMINOPHEN 325 MG PO TABS: 650.0000 mg | ORAL_TABLET | Freq: Four times a day (QID) | ORAL | Status: DC | PRN

## 2017-06-11 MED ORDER — SODIUM CHLORIDE 0.9 % IV SOLN
INTRAVENOUS | Status: DC
  Administered 2017-06-11: 12:00:00 via INTRAVENOUS

## 2017-06-11 MED ORDER — FLEET ENEMA 7-19 GM/118ML RE ENEM: 1.0000 | ENEMA | Freq: Once | RECTAL | Status: DC

## 2017-06-11 MED ORDER — NAPROXEN 250 MG PO TABS
250.0000 mg | ORAL_TABLET | Freq: Two times a day (BID) | ORAL | Status: DC
  Administered 2017-06-11 – 2017-06-12 (×2): 250 mg via ORAL
  Filled 2017-06-11 (×2): qty 1

## 2017-06-11 MED ORDER — HYDRALAZINE HCL 20 MG/ML IJ SOLN: 5.0000 mg | Freq: Four times a day (QID) | INTRAMUSCULAR | Status: DC | PRN

## 2017-06-11 MED ORDER — ONDANSETRON HCL 4 MG PO TABS: 4.0000 mg | ORAL_TABLET | Freq: Four times a day (QID) | ORAL | Status: DC | PRN

## 2017-06-11 MED ORDER — AMLODIPINE BESYLATE 5 MG PO TABS
5.0000 mg | ORAL_TABLET | Freq: Every day | ORAL | Status: DC
  Administered 2017-06-11 – 2017-06-12 (×2): 5 mg via ORAL
  Filled 2017-06-11 (×2): qty 1

## 2017-06-11 MED ORDER — POTASSIUM CHLORIDE IN NACL 20-0.9 MEQ/L-% IV SOLN
INTRAVENOUS | Status: DC
  Administered 2017-06-11: 18:00:00 via INTRAVENOUS

## 2017-06-11 MED ORDER — ENOXAPARIN SODIUM 30 MG/0.3ML ~~LOC~~ SOLN
30.0000 mg | SUBCUTANEOUS | Status: DC
  Administered 2017-06-11: 30 mg via SUBCUTANEOUS
  Filled 2017-06-11: qty 0.3

## 2017-06-11 MED ORDER — POLYVINYL ALCOHOL 1.4 % OP SOLN: 1.0000 [drp] | Freq: Every day | OPHTHALMIC | Status: DC | PRN

## 2017-06-11 MED ORDER — ALPRAZOLAM 0.25 MG PO TABS
0.2500 mg | ORAL_TABLET | Freq: Three times a day (TID) | ORAL | Status: DC | PRN
Start: 2017-06-11 — End: 2017-06-12
  Filled 2017-06-11: qty 1

## 2017-06-11 MED ORDER — ACETAMINOPHEN 650 MG RE SUPP: 650.0000 mg | Freq: Four times a day (QID) | RECTAL | Status: DC | PRN

## 2017-06-11 NOTE — ED Notes (Signed)
Went into pt's room to do Orthostatic Vital Signs and pt and pt's family member both stated that they were not comfortable getting her to stand up for orthostatic vital signs until she has rested for a bit. Will return to room for second attempt

## 2017-06-11 NOTE — ED Notes (Signed)
Dr. McManus at bedside. 

## 2017-06-11 NOTE — ED Notes (Signed)
Labs drawn Pt to rad  Per daughter, pt's knees gave out and pt fell Per pt, she reports that she doesn't move as she once did not that she is in her 70s

## 2017-06-11 NOTE — ED Provider Notes (Signed)
[facility] EMERGENCY DEPARTMENT Provider Note   CSN: [id number] Arrival date & time: 06/11/17  0935     History   Chief Complaint Chief Complaint  Patient presents with  . Fall    HPI Sharon Mason is a 83 y.o. female.   Fall     Pt was seen at 1000. Per pt and her family, c/o gradual onset and worsening of persistent generalized weakness that began yesterday. Pt states she was walking at home felt her "knees give out," and she fell yesterday evening. Pt states her right hip "has been hurting for a while now" but denies new injury during the fall. Pt states she was too weak to get up, and laid on the floor "for a while." Family states pt was able to call them approximately 1900/1930 last night, and they came over to help her up off the floor and get into bed. Pt was unable to get out of bed today due to generalized weakness. Pt lives at home alone. Denies UE's or LE's pain after the fall. Denies syncope/LOC, no AMS, no neck or back pain, no CP/SOB, no cough, no abd pain, no N/V/D, no fevers, no focal motor weakness, no tingling/numbness in extremities.   Past Medical History:  Diagnosis Date  . Anxiety   . Headache   . Hypertension     Patient Active Problem List   Diagnosis Date Noted  . Mild dehydration 07/25/2016  . Generalized weakness 07/25/2016  . Elevated troponin 07/24/2016    Past Surgical History:  Procedure Laterality Date  . EYE SURGERY    . SKIN BIOPSY    . skin cancer removal      OB History    Gravida Para Term Preterm AB Living             1   SAB TAB Ectopic Multiple Live Births                   Home Medications    Prior to Admission medications   Medication Sig Start Date End Date Taking? Authorizing Provider  acetaminophen (TYLENOL) 500 MG tablet Take 1,000 mg by mouth every 6 (six) hours as needed.    Yes [provider]  ALPRAZolam (XANAX) 0.25 MG tablet Take 0.25 mg by mouth 3 (three) times daily as needed for anxiety.    Yes [provider]  carbamide peroxide (DEBROX) 6.5 % OTIC solution Place 5 drops into both ears daily as needed.   Yes [provider]  Carboxymethylcellul-Glycerin (CLEAR EYES FOR DRY EYES) 1-0.25 % SOLN Place 1-2 drops into both eyes daily as needed.   Yes [provider]  naproxen sodium (ANAPROX) 220 MG tablet Take 220 mg by mouth 2 (two) times daily with a meal.   Yes [provider]  polyethylene glycol (MIRALAX / GLYCOLAX) packet Take 17 g by mouth daily as needed.   Yes [provider]  shark liver oil-cocoa butter (PREPARATION H) 0.25-88.44 % suppository Place 1 suppository rectally as needed for hemorrhoids.   Yes [provider]    Family History History reviewed. No pertinent family history.  Social History Social History   Tobacco Use  . Smoking status: Never Smoker  . Smokeless tobacco: Never Used  Substance Use Topics  . Alcohol use: No  . Drug use: No     Allergies   Aspirin and Neosporin [neomycin-bacitracin zn-polymyx]   Review of Systems Review of Systems ROS: Statement: All systems negative except as  marked or noted in the HPI; Constitutional: Negative for fever and chills. +generalized weakness.; ; Eyes: Negative for eye pain, redness and discharge. ; ; ENMT: Negative for ear pain, hoarseness, nasal congestion, sinus pressure and sore throat. ; ; Cardiovascular: Negative for chest pain, palpitations, diaphoresis, dyspnea and peripheral edema. ; ; Respiratory: Negative for cough, wheezing and stridor. ; ; Gastrointestinal: Negative for nausea, vomiting, diarrhea, abdominal pain, blood in stool, hematemesis, jaundice and rectal bleeding. . ; ; Genitourinary: Negative for dysuria, flank pain and hematuria. ; ; Musculoskeletal: Negative for back pain and neck pain. Negative for swelling and trauma.; ; Skin: Negative for pruritus, rash, abrasions, blisters, bruising and skin lesion.; ; Neuro: Negative for headache,  lightheadedness and neck stiffness. Negative for altered level of consciousness, altered mental status, extremity weakness, paresthesias, involuntary movement, seizure and syncope.       Physical Exam Updated Vital Signs BP (!) 185/66 (BP Location: Left Arm)   Pulse 63   Temp 98.1 F (36.7 C) (Oral)   Resp 20   Ht 5' (1.524 m)   Wt 38.6 kg (85 lb)   SpO2 96%   BMI 16.60 kg/m    11:32 Orthostatic Vital Signs KS  Orthostatic Lying   BP- Lying:  206/65  Pulse- Lying: 61      Orthostatic Sitting  BP- Sitting: 196/84  Pulse- Sitting: 68      Orthostatic Standing at 0 minutes  BP- Standing at 0 minutes: 158/87  Pulse- Standing at 0 minutes: 87      Physical Exam 1005: Physical examination:  Nursing notes reviewed; Vital signs and O2 SAT reviewed;  Constitutional: Thin, frail. In no acute distress; Head:  Normocephalic, atraumatic; Eyes: EOMI, PERRL, No scleral icterus; ENMT: Mouth and pharynx normal, Mucous membranes dry; Neck: Supple, Full range of motion, No lymphadenopathy; Cardiovascular: Regular rate and rhythm, No gallop; Respiratory: Breath sounds clear & equal bilaterally, No rales, rhonchi, wheezes.  Speaking full sentences with ease, Normal respiratory effort/excursion; Chest: Nontender, Movement normal; Abdomen: Soft, Nontender, Nondistended, Normal bowel sounds; Genitourinary: No CVA tenderness; Spine:  No midline CS, TS, LS tenderness.;; Extremities: Pulses normal, Pelvis stable. NT bilat hips/knees/ankles. No deformity, No edema.; Neuro: AA&Ox3, Major CN grossly intact. No facial droop. Speech clear. Grips equal. Strength 5/5 equal bilat UE's and LE's. Moves all extremities spontaneously and to command without apparent gross focal motor deficits.; Skin: Color normal, Warm, Dry.   ED Treatments / Results  Labs (all labs ordered are listed, but only abnormal results are displayed)   EKG  EKG Interpretation  Date/Time:  Sunday June 11 2017 10:05:14  EST Ventricular Rate:  64 PR Interval:    QRS Duration: 83 QT Interval:  446 QTC Calculation: 461 R Axis:   45 Text Interpretation:  Sinus rhythm Atrial premature complex Consider left ventricular hypertrophy Baseline wander When compared with ECG of 12/01/2016 QT has shortened Confirmed by Francine Graven [phone]) on 06/11/2017 10:46:44 AM       Radiology   Procedures Procedures (including critical care time)  Medications Ordered in ED Medications - No data to display   Initial Impression / Assessment and Plan / ED Course  I have reviewed the triage vital signs and the nursing notes.  Pertinent labs & imaging results that were available during my care of the patient were reviewed by me and considered in my medical decision making (see chart for details).  MDM Reviewed: previous chart, nursing note and vitals Reviewed previous: labs and ECG Interpretation: labs, ECG,  x-ray and CT scan   Results for orders placed or performed during the hospital encounter of 06/11/17  Urinalysis, Routine w reflex microscopic  Result Value Ref Range   Color, Urine STRAW (A) YELLOW   APPearance CLEAR CLEAR   Specific Gravity, Urine 1.004 (L) 1.005 - 1.030   pH 8.0 5.0 - 8.0   Glucose, UA NEGATIVE NEGATIVE mg/dL   Hgb urine dipstick NEGATIVE NEGATIVE   Bilirubin Urine NEGATIVE NEGATIVE   Ketones, ur NEGATIVE NEGATIVE mg/dL   Protein, ur NEGATIVE NEGATIVE mg/dL   Nitrite NEGATIVE NEGATIVE   Leukocytes, UA NEGATIVE NEGATIVE  Comprehensive metabolic panel  Result Value Ref Range   Sodium 142 135 - 145 mmol/L   Potassium 3.7 3.5 - 5.1 mmol/L   Chloride 103 101 - 111 mmol/L   CO2 29 22 - 32 mmol/L   Glucose, Bld 86 65 - 99 mg/dL   BUN 17 6 - 20 mg/dL   Creatinine, Ser 0.88 0.44 - 1.00 mg/dL   Calcium 9.9 8.9 - 10.3 mg/dL   Total Protein 7.0 6.5 - 8.1 g/dL   Albumin 3.7 3.5 - 5.0 g/dL   AST 24 15 - 41 U/L   ALT 14 14 - 54 U/L   Alkaline Phosphatase 69 38 - 126 U/L   Total Bilirubin  0.6 0.3 - 1.2 mg/dL   GFR calc non Af Amer 56 (L) >60 mL/min   GFR calc Af Amer >60 >60 mL/min   Anion gap 10 5 - 15  Troponin I  Result Value Ref Range   Troponin I 0.03 (HH) <0.03 ng/mL  Lactic acid, plasma  Result Value Ref Range   Lactic Acid, Venous 0.7 0.5 - 1.9 mmol/L  CBC with Differential  Result Value Ref Range   WBC 8.2 4.0 - 10.5 K/uL   RBC 3.94 3.87 - 5.11 MIL/uL   Hemoglobin 11.9 (L) 12.0 - 15.0 g/dL   HCT 39.3 36.0 - 46.0 %   MCV 99.7 78.0 - 100.0 fL   MCH 30.2 26.0 - 34.0 pg   MCHC 30.3 30.0 - 36.0 g/dL   RDW 13.1 11.5 - 15.5 %   Platelets 264 150 - 400 K/uL   Neutrophils Relative % 81 %   Neutro Abs 6.7 1.7 - 7.7 K/uL   Lymphocytes Relative 12 %   Lymphs Abs 1.0 0.7 - 4.0 K/uL   Monocytes Relative 5 %   Monocytes Absolute 0.4 0.1 - 1.0 K/uL   Eosinophils Relative 2 %   Eosinophils Absolute 0.1 0.0 - 0.7 K/uL   Basophils Relative 0 %   Basophils Absolute 0.0 0.0 - 0.1 K/uL  CK  Result Value Ref Range   Total CK 256 (H) 38 - 234 U/L   Dg Chest 1 View Result Date: 06/11/2017 CLINICAL DATA:  83 year old female with right hip pain after falling EXAM: CHEST 1 VIEW COMPARISON:  Prior chest x-ray 12/01/2016 FINDINGS: Stable cardiomegaly. Atherosclerotic calcifications again noted in the transverse aorta. No evidence of pneumothorax or pleural effusion. Chronic bronchitic changes and hyperinflation appear similar. No evidence of acute displaced fracture. IMPRESSION: 1. Stable chest x-ray without evidence of acute cardiopulmonary disease. Electronically Signed   By: Jacqulynn Cadet M.D.   On: 06/11/2017 11:31   Ct Head Wo Contrast Result Date: 06/11/2017 CLINICAL DATA:  Generalize weakness. Tripped and fell on the hallway. EXAM: CT HEAD WITHOUT CONTRAST CT CERVICAL SPINE WITHOUT CONTRAST TECHNIQUE: Multidetector CT imaging of the head and cervical spine was performed following the standard protocol  without intravenous contrast. Multiplanar CT image reconstructions of  the cervical spine were also generated. COMPARISON:  None. FINDINGS: CT HEAD FINDINGS Brain: No evidence of acute infarction, hemorrhage, extra-axial collection, ventriculomegaly, or mass effect. Generalized cerebral atrophy. Periventricular white matter low attenuation likely secondary to microangiopathy. Vascular: Cerebrovascular atherosclerotic calcifications are noted. Skull: Negative for fracture or focal lesion. Sinuses/Orbits: Visualized portions of the orbits are unremarkable. Mastoid sinuses are clear. Left maxillary sinus mucosal thickening. Other: None. CT CERVICAL SPINE FINDINGS Alignment: Normal. Skull base and vertebrae: No acute fracture. No primary bone lesion or focal pathologic process. Soft tissues and spinal canal: No prevertebral fluid or swelling. No visible canal hematoma. Disc levels: Degenerative disc disease with mild disc height loss at C3-4. Degenerative disc disease with severe disc height loss at C5-6 and C6-7. Small central disc protrusion at C2-3. Broad-based disc bulge at C3-4 with bilateral facet arthropathy and left foraminal stenosis. At C4-5 there is severe left facet arthropathy with left foraminal stenosis. At C5-6 there is a broad-based disc osteophyte complex, moderate bilateral facet arthropathy, bilateral uncovertebral degenerative changes and bilateral foraminal stenosis. Broad-based disc osteophyte complex that C6-7 with bilateral uncovertebral degenerative changes, bilateral facet arthropathy and bilateral foraminal stenosis. Upper chest: Lung apices are clear. Other: No fluid collection or hematoma. Bilateral carotid artery atherosclerosis. IMPRESSION: 1. No acute intracranial pathology. 2. No acute osseous injury the cervical spine. 3. Cervical spine spondylosis. Electronically Signed   By: Kathreen Devoid   On: 06/11/2017 11:34   Ct Cervical Spine Wo Contrast Result Date: 06/11/2017 CLINICAL DATA:  Generalize weakness. Tripped and fell on the hallway. EXAM: CT HEAD  WITHOUT CONTRAST CT CERVICAL SPINE WITHOUT CONTRAST TECHNIQUE: Multidetector CT imaging of the head and cervical spine was performed following the standard protocol without intravenous contrast. Multiplanar CT image reconstructions of the cervical spine were also generated. COMPARISON:  None. FINDINGS: CT HEAD FINDINGS Brain: No evidence of acute infarction, hemorrhage, extra-axial collection, ventriculomegaly, or mass effect. Generalized cerebral atrophy. Periventricular white matter low attenuation likely secondary to microangiopathy. Vascular: Cerebrovascular atherosclerotic calcifications are noted. Skull: Negative for fracture or focal lesion. Sinuses/Orbits: Visualized portions of the orbits are unremarkable. Mastoid sinuses are clear. Left maxillary sinus mucosal thickening. Other: None. CT CERVICAL SPINE FINDINGS Alignment: Normal. Skull base and vertebrae: No acute fracture. No primary bone lesion or focal pathologic process. Soft tissues and spinal canal: No prevertebral fluid or swelling. No visible canal hematoma. Disc levels: Degenerative disc disease with mild disc height loss at C3-4. Degenerative disc disease with severe disc height loss at C5-6 and C6-7. Small central disc protrusion at C2-3. Broad-based disc bulge at C3-4 with bilateral facet arthropathy and left foraminal stenosis. At C4-5 there is severe left facet arthropathy with left foraminal stenosis. At C5-6 there is a broad-based disc osteophyte complex, moderate bilateral facet arthropathy, bilateral uncovertebral degenerative changes and bilateral foraminal stenosis. Broad-based disc osteophyte complex that C6-7 with bilateral uncovertebral degenerative changes, bilateral facet arthropathy and bilateral foraminal stenosis. Upper chest: Lung apices are clear. Other: No fluid collection or hematoma. Bilateral carotid artery atherosclerosis. IMPRESSION: 1. No acute intracranial pathology. 2. No acute osseous injury the cervical spine. 3.  Cervical spine spondylosis. Electronically Signed   By: Kathreen Devoid   On: 06/11/2017 11:34   Dg Hip Unilat With Pelvis 2-3 Views Right Result Date: 06/11/2017 CLINICAL DATA:  82 year old female status post fall with right hip pain EXAM: DG HIP (WITH OR WITHOUT PELVIS) 2-3V RIGHT COMPARISON:  None. FINDINGS: There is no evidence  of hip fracture or dislocation. There is no evidence of arthropathy or other focal bone abnormality. The bones appear significantly demineralized. The bony pelvis is intact. Atherosclerotic vascular calcifications are noted throughout the visualized vascular structures. IMPRESSION: 1. No fracture identified. 2. The bones appear significantly demineralized which can limit detection for nondisplaced fracture. 3. Atherosclerotic vascular calcifications. Electronically Signed   By: Jacqulynn Cadet M.D.   On: 06/11/2017 11:33    1230:  BP drop during orthostatic VS, pt c/o increasing weakness and CK elevated; will dose judicious IVF. Concern regarding d/c home alone. T/C to [facility]. Roderic Palau, case discussed, including:  HPI, pertinent PM/SHx, VS/PE, dx testing, ED course and treatment:  Agreeable to observation admit.     Final Clinical Impressions(s) / ED Diagnoses   Final diagnoses:  None    ED Discharge Orders    None        Francine Graven, DO 06/13/17 0830

## 2017-06-11 NOTE — H&P (Signed)
History and Physical    SONOMA FIRKUS ZOX:[id number] DOB: 03/15/1924 DOA: 06/11/2017  PCP: [name], MD  Patient coming from: Home  I have personally briefly reviewed patient's old medical records in [place]  Chief Complaint: Fall  HPI: Sharon Mason is a 83 y.o. female with medical history significant of anxiety, presented to the hospital after suffering a fall.  Patient she reports that she was in her usual state of health prior to fall.  She developed generalized weakness" her knees gave out".  Patient fell yesterday evening, and complained of pain in her right hip.  The family had stated this was approximately 7 PM on the night prior to admission.  They came to check on patient, got her into bed.  This morning she still had difficulty getting up due to pain in her hip as well as generalized weakness.  She was brought to the hospital for evaluation.  She has not had any recent fever, cough, vomiting, diarrhea.  She chronically is constipated.  She has not had any dysuria.  Daughter does report frequency.  Patient otherwise does live independently.  Her daughter just checking on her and help her out with some chores.  Patient does still do many household chores on her own.  ED Course: Labs were largely unremarkable.  Imaging did not reveal any acute process including CT head, C-spine, x-rays of her hip.  Urinalysis was unrevealing.  Clinically, she was noted to be mildly dehydrated.  She was hypertensive, but did have a decline in blood pressure on standing.  She is been referred for admission.  Review of Systems: As per HPI otherwise 10 point review of systems negative.    Past Medical History:  Diagnosis Date  . Anxiety   . Headache   . Hypertension     Past Surgical History:  Procedure Laterality Date  . EYE SURGERY    . SKIN BIOPSY    . skin cancer removal       reports that  has never smoked. she has never used smokeless tobacco. She reports that she does not  drink alcohol or use drugs.  Allergies  Allergen Reactions  . Aspirin Nausea Only  . Neosporin [Neomycin-Bacitracin Zn-Polymyx] Rash    Family history: Family history reviewed and not pertinent   Prior to Admission medications   Medication Sig Start Date End Date Taking? Authorizing Provider  acetaminophen (TYLENOL) 500 MG tablet Take 1,000 mg by mouth every 6 (six) hours as needed.    Yes [provider]  ALPRAZolam (XANAX) 0.25 MG tablet Take 0.25 mg by mouth 3 (three) times daily as needed for anxiety.   Yes [provider]  carbamide peroxide (DEBROX) 6.5 % OTIC solution Place 5 drops into both ears daily as needed.   Yes [provider]  Carboxymethylcellul-Glycerin (CLEAR EYES FOR DRY EYES) 1-0.25 % SOLN Place 1-2 drops into both eyes daily as needed.   Yes [provider]  naproxen sodium (ANAPROX) 220 MG tablet Take 220 mg by mouth 2 (two) times daily with a meal.   Yes [provider]  polyethylene glycol (MIRALAX / GLYCOLAX) packet Take 17 g by mouth daily as needed.   Yes [provider]  shark liver oil-cocoa butter (PREPARATION H) 0.25-88.44 % suppository Place 1 suppository rectally as needed for hemorrhoids.   Yes [provider]    Physical Exam: Vitals:   06/11/17 1130 06/11/17 1230 06/11/17 1300 06/11/17 1546  BP: (!) 213/72 Marland Kitchen)  192/73 (!) 175/73 (!) 184/65  Pulse: 66 66 67 68  Resp: 15 11 19 18   Temp:    98.4 F (36.9 C)  TempSrc:    Oral  SpO2: 97% 95% 95%   Weight:    41.7 kg (91 lb 14.9 oz)  Height:    5' (1.524 m)    Constitutional: NAD, calm, comfortable Vitals:   06/11/17 1130 06/11/17 1230 06/11/17 1300 06/11/17 1546  BP: (!) 213/72 (!) 192/73 (!) 175/73 (!) 184/65  Pulse: 66 66 67 68  Resp: 15 11 19 18   Temp:    98.4 F (36.9 C)  TempSrc:    Oral  SpO2: 97% 95% 95%   Weight:    41.7 kg (91 lb 14.9 oz)  Height:    5' (1.524 m)   Eyes: PERRL, lids and conjunctivae normal ENMT:  Mucous membranes are dry. Posterior pharynx clear of any exudate or lesions.Normal dentition.  Neck: normal, supple, no masses, no thyromegaly Respiratory: clear to auscultation bilaterally, no wheezing, no crackles. Normal respiratory effort. No accessory muscle use.  Cardiovascular: Regular rate and rhythm, no murmurs / rubs / gallops.  Mild swelling of lower extremities, right greater than left. 2+ pedal pulses. No carotid bruits.  Abdomen: no tenderness, no masses palpated. No hepatosplenomegaly. Bowel sounds positive.  Musculoskeletal: no clubbing / cyanosis. No joint deformity upper and lower extremities. Good ROM, no contractures. Normal muscle tone.  Skin: no rashes, lesions, ulcers. No induration Neurologic: CN 2-12 grossly intact. Sensation intact, DTR normal. Strength 5/5 in all 4.  Psychiatric: Normal judgment and insight. Alert and oriented x 3. Normal mood.    Labs on Admission: I have personally reviewed following labs and imaging studies  CBC: Recent Labs  Lab 06/11/17 1050  WBC 8.2  NEUTROABS 6.7  HGB 11.9*  HCT 39.3  MCV 99.7  PLT 478   Basic Metabolic Panel: Recent Labs  Lab 06/11/17 1050  NA 142  K 3.7  CL 103  CO2 29  GLUCOSE 86  BUN 17  CREATININE 0.88  CALCIUM 9.9   GFR: Estimated Creatinine Clearance: 27.4 mL/min (by C-G formula based on SCr of 0.88 mg/dL). Liver Function Tests: Recent Labs  Lab 06/11/17 1050  AST 24  ALT 14  ALKPHOS 69  BILITOT 0.6  PROT 7.0  ALBUMIN 3.7   No results for input(s): LIPASE, AMYLASE in the last 168 hours. No results for input(s): AMMONIA in the last 168 hours. Coagulation Profile: No results for input(s): INR, PROTIME in the last 168 hours. Cardiac Enzymes: Recent Labs  Lab 06/11/17 1050  CKTOTAL 256*  TROPONINI 0.03*   BNP (last 3 results) No results for input(s): PROBNP in the last 8760 hours. HbA1C: No results for input(s): HGBA1C in the last 72 hours. CBG: No results for input(s): GLUCAP in  the last 168 hours. Lipid Profile: No results for input(s): CHOL, HDL, LDLCALC, TRIG, CHOLHDL, LDLDIRECT in the last 72 hours. Thyroid Function Tests: No results for input(s): TSH, T4TOTAL, FREET4, T3FREE, THYROIDAB in the last 72 hours. Anemia Panel: No results for input(s): VITAMINB12, FOLATE, FERRITIN, TIBC, IRON, RETICCTPCT in the last 72 hours. Urine analysis:    Component Value Date/Time   COLORURINE STRAW (A) 06/11/2017 0953   APPEARANCEUR CLEAR 06/11/2017 0953   LABSPEC 1.004 (L) 06/11/2017 0953   PHURINE 8.0 06/11/2017 [place] 06/11/2017 0953   HGBUR NEGATIVE 06/11/2017 [place] NEGATIVE 06/11/2017 [place] 06/11/2017 0953   PROTEINUR  NEGATIVE 06/11/2017 0953   NITRITE NEGATIVE 06/11/2017 0953   LEUKOCYTESUR NEGATIVE 06/11/2017 0953    Radiological Exams on Admission: Dg Chest 1 View  Result Date: 06/11/2017 CLINICAL DATA:  83 year old female with right hip pain after falling EXAM: CHEST 1 VIEW COMPARISON:  Prior chest x-ray 12/01/2016 FINDINGS: Stable cardiomegaly. Atherosclerotic calcifications again noted in the transverse aorta. No evidence of pneumothorax or pleural effusion. Chronic bronchitic changes and hyperinflation appear similar. No evidence of acute displaced fracture. IMPRESSION: 1. Stable chest x-ray without evidence of acute cardiopulmonary disease. Electronically Signed   By: Jacqulynn Cadet M.D.   On: 06/11/2017 11:31   Ct Head Wo Contrast  Result Date: 06/11/2017 CLINICAL DATA:  Generalize weakness. Tripped and fell on the hallway. EXAM: CT HEAD WITHOUT CONTRAST CT CERVICAL SPINE WITHOUT CONTRAST TECHNIQUE: Multidetector CT imaging of the head and cervical spine was performed following the standard protocol without intravenous contrast. Multiplanar CT image reconstructions of the cervical spine were also generated. COMPARISON:  None. FINDINGS: CT HEAD FINDINGS Brain: No evidence of acute infarction, hemorrhage,  extra-axial collection, ventriculomegaly, or mass effect. Generalized cerebral atrophy. Periventricular white matter low attenuation likely secondary to microangiopathy. Vascular: Cerebrovascular atherosclerotic calcifications are noted. Skull: Negative for fracture or focal lesion. Sinuses/Orbits: Visualized portions of the orbits are unremarkable. Mastoid sinuses are clear. Left maxillary sinus mucosal thickening. Other: None. CT CERVICAL SPINE FINDINGS Alignment: Normal. Skull base and vertebrae: No acute fracture. No primary bone lesion or focal pathologic process. Soft tissues and spinal canal: No prevertebral fluid or swelling. No visible canal hematoma. Disc levels: Degenerative disc disease with mild disc height loss at C3-4. Degenerative disc disease with severe disc height loss at C5-6 and C6-7. Small central disc protrusion at C2-3. Broad-based disc bulge at C3-4 with bilateral facet arthropathy and left foraminal stenosis. At C4-5 there is severe left facet arthropathy with left foraminal stenosis. At C5-6 there is a broad-based disc osteophyte complex, moderate bilateral facet arthropathy, bilateral uncovertebral degenerative changes and bilateral foraminal stenosis. Broad-based disc osteophyte complex that C6-7 with bilateral uncovertebral degenerative changes, bilateral facet arthropathy and bilateral foraminal stenosis. Upper chest: Lung apices are clear. Other: No fluid collection or hematoma. Bilateral carotid artery atherosclerosis. IMPRESSION: 1. No acute intracranial pathology. 2. No acute osseous injury the cervical spine. 3. Cervical spine spondylosis. Electronically Signed   By: Kathreen Devoid   On: 06/11/2017 11:34   Ct Cervical Spine Wo Contrast  Result Date: 06/11/2017 CLINICAL DATA:  Generalize weakness. Tripped and fell on the hallway. EXAM: CT HEAD WITHOUT CONTRAST CT CERVICAL SPINE WITHOUT CONTRAST TECHNIQUE: Multidetector CT imaging of the head and cervical spine was performed  following the standard protocol without intravenous contrast. Multiplanar CT image reconstructions of the cervical spine were also generated. COMPARISON:  None. FINDINGS: CT HEAD FINDINGS Brain: No evidence of acute infarction, hemorrhage, extra-axial collection, ventriculomegaly, or mass effect. Generalized cerebral atrophy. Periventricular white matter low attenuation likely secondary to microangiopathy. Vascular: Cerebrovascular atherosclerotic calcifications are noted. Skull: Negative for fracture or focal lesion. Sinuses/Orbits: Visualized portions of the orbits are unremarkable. Mastoid sinuses are clear. Left maxillary sinus mucosal thickening. Other: None. CT CERVICAL SPINE FINDINGS Alignment: Normal. Skull base and vertebrae: No acute fracture. No primary bone lesion or focal pathologic process. Soft tissues and spinal canal: No prevertebral fluid or swelling. No visible canal hematoma. Disc levels: Degenerative disc disease with mild disc height loss at C3-4. Degenerative disc disease with severe disc height loss at C5-6 and C6-7. Small central disc protrusion at  C2-3. Broad-based disc bulge at C3-4 with bilateral facet arthropathy and left foraminal stenosis. At C4-5 there is severe left facet arthropathy with left foraminal stenosis. At C5-6 there is a broad-based disc osteophyte complex, moderate bilateral facet arthropathy, bilateral uncovertebral degenerative changes and bilateral foraminal stenosis. Broad-based disc osteophyte complex that C6-7 with bilateral uncovertebral degenerative changes, bilateral facet arthropathy and bilateral foraminal stenosis. Upper chest: Lung apices are clear. Other: No fluid collection or hematoma. Bilateral carotid artery atherosclerosis. IMPRESSION: 1. No acute intracranial pathology. 2. No acute osseous injury the cervical spine. 3. Cervical spine spondylosis. Electronically Signed   By: Kathreen Devoid   On: 06/11/2017 11:34   Dg Hip Unilat With Pelvis 2-3 Views  Right  Result Date: 06/11/2017 CLINICAL DATA:  83 year old female status post fall with right hip pain EXAM: DG HIP (WITH OR WITHOUT PELVIS) 2-3V RIGHT COMPARISON:  None. FINDINGS: There is no evidence of hip fracture or dislocation. There is no evidence of arthropathy or other focal bone abnormality. The bones appear significantly demineralized. The bony pelvis is intact. Atherosclerotic vascular calcifications are noted throughout the visualized vascular structures. IMPRESSION: 1. No fracture identified. 2. The bones appear significantly demineralized which can limit detection for nondisplaced fracture. 3. Atherosclerotic vascular calcifications. Electronically Signed   By: Jacqulynn Cadet M.D.   On: 06/11/2017 11:33    EKG: Independently reviewed. Sinus rhythm without acute changes  Assessment/Plan Active Problems:   Mild dehydration   Generalized weakness   Fall   Essential hypertension   1. Fall.  Suspect this was related to generalized weakness, possibly related to dehydration.  Neurologically, she appears to be intact.  She was mildly orthostatic on admission.  We will continue with IV hydration overnight.  Physical therapy evaluation. 2. Hypertension.  Blood pressure elevated since admission.  Daughter reports that patient was on amlodipine in the past, but was taken off of this due to acceptable blood pressures.  We will restart her on low-dose amlodipine at this time. 3. Anxiety.  Continue home dose of Xanax.  DVT prophylaxis: lovenox Code Status: DNR Family Communication: discussed with daughter at the bedside Disposition Plan: likely discharge home in AM, pending PT eval Consults called:  Admission status: observation, medsurg   Kathie Dike MD [facility] Pager [phone]  If 7PM-7AM, please contact night-coverage [email or web address] Password [facility]  06/11/2017, 5:35 PM

## 2017-06-11 NOTE — ED Notes (Signed)
Call for report Will call back

## 2017-06-11 NOTE — ED Notes (Signed)
From rad 

## 2017-06-11 NOTE — Progress Notes (Signed)
Attempted to call report to ED. Said nurse would call me back.

## 2017-06-11 NOTE — ED Triage Notes (Signed)
PT states she has had generalized weakness worsening since yesterday and tripped in the hallway and fell. PT c/o right foot pain and left hip pain. Daughter at bedside and states pt wasn't able to get out of the floor herself but made it to the phone to call her for help yesterday evening.

## 2017-06-12 ENCOUNTER — Observation Stay (HOSPITAL_COMMUNITY): Payer: Medicare Other

## 2017-06-12 DIAGNOSIS — W19XXXA Unspecified fall, initial encounter: Secondary | ICD-10-CM | POA: Diagnosis not present

## 2017-06-12 DIAGNOSIS — I82491 Acute embolism and thrombosis of other specified deep vein of right lower extremity: Secondary | ICD-10-CM

## 2017-06-12 DIAGNOSIS — I82401 Acute embolism and thrombosis of unspecified deep veins of right lower extremity: Secondary | ICD-10-CM | POA: Diagnosis present

## 2017-06-12 DIAGNOSIS — Y92009 Unspecified place in unspecified non-institutional (private) residence as the place of occurrence of the external cause: Secondary | ICD-10-CM | POA: Diagnosis not present

## 2017-06-12 DIAGNOSIS — R531 Weakness: Secondary | ICD-10-CM | POA: Diagnosis not present

## 2017-06-12 DIAGNOSIS — I10 Essential (primary) hypertension: Secondary | ICD-10-CM | POA: Diagnosis not present

## 2017-06-12 DIAGNOSIS — E860 Dehydration: Secondary | ICD-10-CM | POA: Diagnosis not present

## 2017-06-12 DIAGNOSIS — R600 Localized edema: Secondary | ICD-10-CM | POA: Diagnosis not present

## 2017-06-12 LAB — BASIC METABOLIC PANEL
ANION GAP: 10 (ref 5–15)
BUN: 19 mg/dL (ref 6–20)
CALCIUM: 9.1 mg/dL (ref 8.9–10.3)
CO2: 25 mmol/L (ref 22–32)
Chloride: 108 mmol/L (ref 101–111)
Creatinine, Ser: 0.93 mg/dL (ref 0.44–1.00)
GFR calc Af Amer: >60 mL/min (ref >60)
GFR, EST NON AFRICAN AMERICAN: 52 mL/min — AB (ref >60)
GLUCOSE: 70 mg/dL (ref 65–99)
Potassium: 4 mmol/L (ref 3.5–5.1)
SODIUM: 143 mmol/L (ref 135–145)

## 2017-06-12 LAB — CBC
HCT: 34.9 % — ABNORMAL LOW (ref 36.0–46.0)
Hemoglobin: 10.8 g/dL — ABNORMAL LOW (ref 12.0–15.0)
MCH: 30.9 pg (ref 26.0–34.0)
MCHC: 30.9 g/dL (ref 30.0–36.0)
MCV: 100 fL (ref 78.0–100.0)
PLATELETS: 258 10*3/uL (ref 150–400)
RBC: 3.49 MIL/uL — AB (ref 3.87–5.11)
RDW: 13.4 % (ref 11.5–15.5)
WBC: 7.4 10*3/uL (ref 4.0–10.5)

## 2017-06-12 MED ORDER — APIXABAN 5 MG PO TABS
10.0000 mg | ORAL_TABLET | Freq: Two times a day (BID) | ORAL | Status: DC
  Administered 2017-06-12: 10 mg via ORAL
  Filled 2017-06-12: qty 2

## 2017-06-12 MED ORDER — AMLODIPINE BESYLATE 5 MG PO TABS: 5.0000 mg | ORAL_TABLET | Freq: Every day | ORAL | 0 refills | Status: DC

## 2017-06-12 MED ORDER — APIXABAN 5 MG PO TABS: ORAL_TABLET | ORAL | 2 refills | Status: DC

## 2017-06-12 NOTE — Discharge Summary (Signed)
Physician Discharge Summary  Sharon Mason XKG:[id number] DOB: [birth year] DOA: 06/11/2017  PCP: Sharon Sites, MD  Admit date: 06/11/2017 Discharge date: 06/12/2017  Admitted From: Home Disposition: Home  Recommendations for Outpatient Follow-up:  1. Follow up with PCP in 1-2 weeks 2. Please obtain BMP/CBC in one week  Home Health: Home health PT Equipment/Devices:  Discharge Condition: Stable CODE STATUS: DNR Diet recommendation: Heart Healthy   Brief/Interim Summary: This is a 83 year old female who was admitted to the hospital after suffering a fall.  Patient had generalized weakness and her legs had given out.  Neurologically, she was intact.  She was noted to be mildly dehydrated.  She was hydrated overnight and felt better.  Imaging did not indicate any injuries related to fall.  Physical therapy evaluated the patient and recommended home health physical therapy.  Blood pressure was noted to be elevated throughout her hospital stay and she was started on Norvasc.  Her daughter reports that she had taken this in the past at some point as well.  She was noted to have some swelling in her right lower extremity.  Venous Dopplers confirm right lower extremity DVT.  This may have been related to immobilization after a fall.  She has been started on Eliquis.  Will likely need treatment for another 3 months.  Discharge Diagnoses:  Active Problems:   Mild dehydration   Generalized weakness   Fall   Essential hypertension   Right leg DVT [facility])    Discharge Instructions  Discharge Instructions    Diet - low sodium heart healthy   Complete by:  As directed    Increase activity slowly   Complete by:  As directed      Allergies as of 06/12/2017      Reactions   Aspirin Nausea Only   Neosporin [neomycin-bacitracin Zn-polymyx] Rash      Medication List    TAKE these medications   acetaminophen 500 MG tablet Commonly known as:  TYLENOL Take 1,000 mg by mouth every 6 (six)  hours as needed.   ALPRAZolam 0.25 MG tablet Commonly known as:  XANAX Take 0.25 mg by mouth 3 (three) times daily as needed for anxiety.   amLODipine 5 MG tablet Commonly known as:  NORVASC Take 1 tablet (5 mg total) by mouth daily. Start taking on:  06/13/2017   apixaban 5 MG Tabs tablet Commonly known as:  ELIQUIS Take 10mg  po bid for 7 days then 5mg  po bid   carbamide peroxide 6.5 % OTIC solution Commonly known as:  DEBROX Place 5 drops into both ears daily as needed.   CLEAR EYES FOR DRY EYES 1-0.25 % Soln Generic drug:  Carboxymethylcellul-Glycerin Place 1-2 drops into both eyes daily as needed.   naproxen sodium 220 MG tablet Commonly known as:  ALEVE Take 220 mg by mouth 2 (two) times daily with a meal.   polyethylene glycol packet Commonly known as:  MIRALAX / GLYCOLAX Take 17 g by mouth daily as needed.   PREPARATION H 0.25-88.44 % suppository Generic drug:  shark liver oil-cocoa butter Place 1 suppository rectally as needed for hemorrhoids.      [place] Follow up.   Contact information: [place] [address] [phone]          Allergies  Allergen Reactions  . Aspirin Nausea Only  . Neosporin [Neomycin-Bacitracin Zn-Polymyx] Rash    Consultations:     Procedures/Studies: Dg Chest 1  View  Result Date: 06/11/2017 CLINICAL DATA:  83 year old female with right hip pain after falling EXAM: CHEST 1 VIEW COMPARISON:  Prior chest x-ray 12/01/2016 FINDINGS: Stable cardiomegaly. Atherosclerotic calcifications again noted in the transverse aorta. No evidence of pneumothorax or pleural effusion. Chronic bronchitic changes and hyperinflation appear similar. No evidence of acute displaced fracture. IMPRESSION: 1. Stable chest x-ray without evidence of acute cardiopulmonary disease. Electronically Signed   By: Jacqulynn Cadet M.D.   On: 06/11/2017 11:31   Ct Head Wo Contrast  Result  Date: 06/11/2017 CLINICAL DATA:  Generalize weakness. Tripped and fell on the hallway. EXAM: CT HEAD WITHOUT CONTRAST CT CERVICAL SPINE WITHOUT CONTRAST TECHNIQUE: Multidetector CT imaging of the head and cervical spine was performed following the standard protocol without intravenous contrast. Multiplanar CT image reconstructions of the cervical spine were also generated. COMPARISON:  None. FINDINGS: CT HEAD FINDINGS Brain: No evidence of acute infarction, hemorrhage, extra-axial collection, ventriculomegaly, or mass effect. Generalized cerebral atrophy. Periventricular white matter low attenuation likely secondary to microangiopathy. Vascular: Cerebrovascular atherosclerotic calcifications are noted. Skull: Negative for fracture or focal lesion. Sinuses/Orbits: Visualized portions of the orbits are unremarkable. Mastoid sinuses are clear. Left maxillary sinus mucosal thickening. Other: None. CT CERVICAL SPINE FINDINGS Alignment: Normal. Skull base and vertebrae: No acute fracture. No primary bone lesion or focal pathologic process. Soft tissues and spinal canal: No prevertebral fluid or swelling. No visible canal hematoma. Disc levels: Degenerative disc disease with mild disc height loss at C3-4. Degenerative disc disease with severe disc height loss at C5-6 and C6-7. Small central disc protrusion at C2-3. Broad-based disc bulge at C3-4 with bilateral facet arthropathy and left foraminal stenosis. At C4-5 there is severe left facet arthropathy with left foraminal stenosis. At C5-6 there is a broad-based disc osteophyte complex, moderate bilateral facet arthropathy, bilateral uncovertebral degenerative changes and bilateral foraminal stenosis. Broad-based disc osteophyte complex that C6-7 with bilateral uncovertebral degenerative changes, bilateral facet arthropathy and bilateral foraminal stenosis. Upper chest: Lung apices are clear. Other: No fluid collection or hematoma. Bilateral carotid artery  atherosclerosis. IMPRESSION: 1. No acute intracranial pathology. 2. No acute osseous injury the cervical spine. 3. Cervical spine spondylosis. Electronically Signed   By: Kathreen Devoid   On: 06/11/2017 11:34   Ct Cervical Spine Wo Contrast  Result Date: 06/11/2017 CLINICAL DATA:  Generalize weakness. Tripped and fell on the hallway. EXAM: CT HEAD WITHOUT CONTRAST CT CERVICAL SPINE WITHOUT CONTRAST TECHNIQUE: Multidetector CT imaging of the head and cervical spine was performed following the standard protocol without intravenous contrast. Multiplanar CT image reconstructions of the cervical spine were also generated. COMPARISON:  None. FINDINGS: CT HEAD FINDINGS Brain: No evidence of acute infarction, hemorrhage, extra-axial collection, ventriculomegaly, or mass effect. Generalized cerebral atrophy. Periventricular white matter low attenuation likely secondary to microangiopathy. Vascular: Cerebrovascular atherosclerotic calcifications are noted. Skull: Negative for fracture or focal lesion. Sinuses/Orbits: Visualized portions of the orbits are unremarkable. Mastoid sinuses are clear. Left maxillary sinus mucosal thickening. Other: None. CT CERVICAL SPINE FINDINGS Alignment: Normal. Skull base and vertebrae: No acute fracture. No primary bone lesion or focal pathologic process. Soft tissues and spinal canal: No prevertebral fluid or swelling. No visible canal hematoma. Disc levels: Degenerative disc disease with mild disc height loss at C3-4. Degenerative disc disease with severe disc height loss at C5-6 and C6-7. Small central disc protrusion at C2-3. Broad-based disc bulge at C3-4 with bilateral facet arthropathy and left foraminal stenosis. At C4-5 there is severe left facet arthropathy with left foraminal  stenosis. At C5-6 there is a broad-based disc osteophyte complex, moderate bilateral facet arthropathy, bilateral uncovertebral degenerative changes and bilateral foraminal stenosis. Broad-based disc  osteophyte complex that C6-7 with bilateral uncovertebral degenerative changes, bilateral facet arthropathy and bilateral foraminal stenosis. Upper chest: Lung apices are clear. Other: No fluid collection or hematoma. Bilateral carotid artery atherosclerosis. IMPRESSION: 1. No acute intracranial pathology. 2. No acute osseous injury the cervical spine. 3. Cervical spine spondylosis. Electronically Signed   By: Kathreen Devoid   On: 06/11/2017 11:34   US Venous Img Lower Unilateral Right  Result Date: 06/12/2017 CLINICAL DATA:  Right lower extremity pain and edema.  Recent fall. EXAM: RIGHT LOWER EXTREMITY VENOUS DOPPLER ULTRASOUND TECHNIQUE: Gray-scale sonography with graded compression, as well as color Doppler and duplex ultrasound were performed to evaluate the lower extremity deep venous systems from the level of the common femoral vein and including the common femoral, femoral, profunda femoral, popliteal and calf veins including the posterior tibial, peroneal and gastrocnemius veins when visible. The superficial great saphenous vein was also interrogated. Spectral Doppler was utilized to evaluate flow at rest and with distal augmentation maneuvers in the common femoral, femoral and popliteal veins. COMPARISON:  None. FINDINGS: Contralateral Common Femoral Vein: Respiratory phasicity is normal and symmetric with the symptomatic side. No evidence of thrombus. Normal compressibility. Common Femoral Vein: No evidence of thrombus. Normal compressibility, respiratory phasicity and response to augmentation. Saphenofemoral Junction: No evidence of thrombus. Normal compressibility and flow on color Doppler imaging. Profunda Femoral Vein: No evidence of thrombus. Normal compressibility and flow on color Doppler imaging. Femoral Vein: No evidence of thrombus. Normal compressibility, respiratory phasicity and response to augmentation. Popliteal Vein: No evidence of thrombus. Normal compressibility, respiratory phasicity  and response to augmentation. Calf Veins: There is some thrombus isolated to the peroneal vein below the knee. Posterior and anterior tibial veins appear widely patent. Superficial Great Saphenous Vein: No evidence of thrombus. Normal compressibility. Venous Reflux:  None. Other Findings: No evidence of superficial thrombophlebitis or abnormal fluid collection. IMPRESSION: Right lower extremity deep venous thrombosis isolated to the peroneal vein below the knee. Electronically Signed   By: Aletta Edouard M.D.   On: 06/12/2017 14:42   Dg Hip Unilat With Pelvis 2-3 Views Right  Result Date: 06/11/2017 CLINICAL DATA:  83 year old female status post fall with right hip pain EXAM: DG HIP (WITH OR WITHOUT PELVIS) 2-3V RIGHT COMPARISON:  None. FINDINGS: There is no evidence of hip fracture or dislocation. There is no evidence of arthropathy or other focal bone abnormality. The bones appear significantly demineralized. The bony pelvis is intact. Atherosclerotic vascular calcifications are noted throughout the visualized vascular structures. IMPRESSION: 1. No fracture identified. 2. The bones appear significantly demineralized which can limit detection for nondisplaced fracture. 3. Atherosclerotic vascular calcifications. Electronically Signed   By: Jacqulynn Cadet M.D.   On: 06/11/2017 11:33       Subjective: No new complaints.  Feeling better.  Discharge Exam: Vitals:   06/11/17 2116 06/12/17 0515  BP: (!) 147/67 (!) 165/64  Pulse: 71 69  Resp: 18 18  Temp: 99.9 F (37.7 C) 97.8 F (36.6 C)  SpO2: 96% 96%   Vitals:   06/11/17 1300 06/11/17 1546 06/11/17 2116 06/12/17 0515  BP: (!) 175/73 (!) 184/65 (!) 147/67 (!) 165/64  Pulse: 67 68 71 69  Resp: 19 18 18 18   Temp:  98.4 F (36.9 C) 99.9 F (37.7 C) 97.8 F (36.6 C)  TempSrc:  Oral Oral Oral  SpO2:  95%  96% 96%  Weight:  41.7 kg (91 lb 14.9 oz)    Height:  5' (1.524 m)      General: Pt is alert, awake, not in acute  distress Cardiovascular: RRR, S1/S2 +, no rubs, no gallops Respiratory: CTA bilaterally, no wheezing, no rhonchi Abdominal: Soft, NT, ND, bowel sounds + Extremities: Mild edema in right lower extremity, no cyanosis    The results of significant diagnostics from this hospitalization (including imaging, microbiology, ancillary and laboratory) are listed below for reference.     Microbiology: No results found for this or any previous visit (from the past 240 hour(s)).   Labs: BNP (last 3 results) No results for input(s): BNP in the last 8760 hours. Basic Metabolic Panel: Recent Labs  Lab 06/11/17 1050 06/12/17 0501  NA 142 143  K 3.7 4.0  CL 103 108  CO2 29 25  GLUCOSE 86 70  BUN 17 19  CREATININE 0.88 0.93  CALCIUM 9.9 9.1   Liver Function Tests: Recent Labs  Lab 06/11/17 1050  AST 24  ALT 14  ALKPHOS 69  BILITOT 0.6  PROT 7.0  ALBUMIN 3.7   No results for input(s): LIPASE, AMYLASE in the last 168 hours. No results for input(s): AMMONIA in the last 168 hours. CBC: Recent Labs  Lab 06/11/17 1050 06/12/17 0501  WBC 8.2 7.4  NEUTROABS 6.7  --   HGB 11.9* 10.8*  HCT 39.3 34.9*  MCV 99.7 100.0  PLT 264 258   Cardiac Enzymes: Recent Labs  Lab 06/11/17 1050  CKTOTAL 256*  TROPONINI 0.03*   BNP: Invalid input(s): POCBNP CBG: No results for input(s): GLUCAP in the last 168 hours. D-Dimer No results for input(s): DDIMER in the last 72 hours. Hgb A1c No results for input(s): HGBA1C in the last 72 hours. Lipid Profile No results for input(s): CHOL, HDL, LDLCALC, TRIG, CHOLHDL, LDLDIRECT in the last 72 hours. Thyroid function studies No results for input(s): TSH, T4TOTAL, T3FREE, THYROIDAB in the last 72 hours.  Invalid input(s): FREET3 Anemia work up No results for input(s): VITAMINB12, FOLATE, FERRITIN, TIBC, IRON, RETICCTPCT in the last 72 hours. Urinalysis    Component Value Date/Time   COLORURINE STRAW (A) 06/11/2017 0953   APPEARANCEUR CLEAR  06/11/2017 0953   LABSPEC 1.004 (L) 06/11/2017 0953   PHURINE 8.0 06/11/2017 0953   GLUCOSEU NEGATIVE 06/11/2017 0953   HGBUR NEGATIVE 06/11/2017 0953   BILIRUBINUR NEGATIVE 06/11/2017 0953   KETONESUR NEGATIVE 06/11/2017 0953   PROTEINUR NEGATIVE 06/11/2017 0953   NITRITE NEGATIVE 06/11/2017 0953   LEUKOCYTESUR NEGATIVE 06/11/2017 0953   Sepsis Labs Invalid input(s): PROCALCITONIN,  WBC,  LACTICIDVEN Microbiology No results found for this or any previous visit (from the past 240 hour(s)).   Time coordinating discharge: Over 30 minutes  SIGNED:   Kathie Dike, MD  [facility] 06/12/2017, 6:42 PM Pager   If 7PM-7AM, please contact night-coverage [email or web address] Password [email or web address]

## 2017-06-12 NOTE — Progress Notes (Signed)
ANTICOAGULATION CONSULT NOTE - Initial Consult  Pharmacy Consult for Apixaban Indication: DVT  Allergies  Allergen Reactions  . Aspirin Nausea Only  . Neosporin [Neomycin-Bacitracin Zn-Polymyx] Rash    Patient Measurements: Height: 5' (152.4 cm) Weight: 91 lb 14.9 oz (41.7 kg) IBW/kg (Calculated) : 45.5 Heparin Dosing Weight:   Vital Signs: Temp: 97.8 F (36.6 C) (12/24 0515) Temp Source: Oral (12/24 0515) BP: 165/64 (12/24 0515) Pulse Rate: 69 (12/24 0515)  Labs: Recent Labs    06/11/17 1050 06/12/17 0501  HGB 11.9* 10.8*  HCT 39.3 34.9*  PLT 264 258  CREATININE 0.88 0.93  CKTOTAL 256*  --   TROPONINI 0.03*  --    Estimated Creatinine Clearance: 25.9 mL/min (by C-G formula based on SCr of 0.93 mg/dL).  Medical History: Past Medical History:  Diagnosis Date  . Anxiety   . Headache   . Hypertension    Medications:  Medications Prior to Admission  Medication Sig Dispense Refill Last Dose  . acetaminophen (TYLENOL) 500 MG tablet Take 1,000 mg by mouth every 6 (six) hours as needed.    06/10/2017 at 0900  . ALPRAZolam (XANAX) 0.25 MG tablet Take 0.25 mg by mouth 3 (three) times daily as needed for anxiety.   06/10/2017 at 1800  . carbamide peroxide (DEBROX) 6.5 % OTIC solution Place 5 drops into both ears daily as needed.   Past Month at Unknown time  . Carboxymethylcellul-Glycerin (CLEAR EYES FOR DRY EYES) 1-0.25 % SOLN Place 1-2 drops into both eyes daily as needed.   Past Week at Unknown time  . naproxen sodium (ANAPROX) 220 MG tablet Take 220 mg by mouth 2 (two) times daily with a meal.   06/10/2017 at 1800  . polyethylene glycol (MIRALAX / GLYCOLAX) packet Take 17 g by mouth daily as needed.   06/10/2017 at Unknown time  . shark liver oil-cocoa butter (PREPARATION H) 0.25-88.44 % suppository Place 1 suppository rectally as needed for hemorrhoids.   Past Week at Unknown time   Assessment: Okay for Protocol.  Goal of Therapy:  Systemic Anticoagulation.    Plan:  Apixaban 10mg  PO BID x 7 Days, then 5mg  PO BID Monitor for signs and symptoms of bleeding.  Educate  Pricilla Larsson 06/12/2017,3:44 PM

## 2017-06-12 NOTE — Evaluation (Signed)
Physical Therapy Evaluation Patient Details Name: Sharon Mason MRN: [id number] DOB: 1923/09/16 Today's Date: 06/12/2017   History of Present Illness  Sharon Mason is a 83 y.o. female with medical history significant of anxiety, presented to the hospital after suffering a fall.  Patient she reports that she was in her usual state of health prior to fall.  She developed generalized weakness" her knees gave out".  Patient fell yesterday evening, and complained of pain in her right hip.  The family had stated this was approximately 7 PM on the night prior to admission.  They came to check on patient, got her into bed.  This morning she still had difficulty getting up due to pain in her hip as well as generalized weakness.  She was brought to the hospital for evaluation.  She has not had any recent fever, cough, vomiting, diarrhea.  She chronically is constipated.  She has not had any dysuria.  Daughter does report frequency.  Patient otherwise does live independently.  Her daughter just checking on her and help her out with some chores.  Patient does still do many household chores on her own.    Clinical Impression  Patient functioning near baseline for functional mobility and gait, but limited for gait secondary to c/o fatigue and generalized pain mostly in knees, low back and all over, and patient states she usually take pain medication daily at home.  PLAN:  Patient to be discharged home today with her daughter assisting, patient discharged from physical therapy to care of nursing for ambulation and out of bed as tolerated.    Follow Up Recommendations Home health PT    Equipment Recommendations  None recommended by PT    Recommendations for Other Services       Precautions / Restrictions Precautions Precautions: Fall Restrictions Weight Bearing Restrictions: No      Mobility  Bed Mobility Overal bed mobility: Needs Assistance Bed Mobility: Supine to Sit;Sit to Supine     Supine to  sit: Supervision Sit to supine: Supervision      Transfers Overall transfer level: Needs assistance Equipment used: Rolling walker (2 wheeled) Transfers: Sit to/from [facility] Sit to Stand: Min guard Stand pivot transfers: Min guard          Ambulation/Gait Ambulation/Gait assistance: Min guard Ambulation Distance (Feet): 18 Feet Assistive device: Rolling walker (2 wheeled) Gait Pattern/deviations: Decreased step length - right;Decreased step length - left;Decreased stride length   Gait velocity interpretation: Below normal speed for age/gender General Gait Details: demonstrates slow slightly labored cadence without loss of balance, limited secondary to c/o fatigue  Stairs            Wheelchair Mobility    Modified Rankin (Stroke Patients Only)       Balance Overall balance assessment: Needs assistance Sitting-balance support: No upper extremity supported;Feet supported Sitting balance-Leahy Scale: Good     Standing balance support: Bilateral upper extremity supported;During functional activity Standing balance-Leahy Scale: Fair                               Pertinent Vitals/Pain Pain Assessment: 0-10 Pain Score: 9  Pain Location: knees, low back and generalized pain all over Pain Descriptors / Indicators: Discomfort;Aching Pain Intervention(s): Limited activity within patient's tolerance;Monitored during session    Home Living Family/patient expects to be discharged to:: Private residence Living Arrangements: Alone Available Help at Discharge: Family;Available 24 hours/day Type of Home:  House Home Access: Stairs to enter Entrance Stairs-Rails: None Entrance Stairs-Number of Steps: 1 Home Layout: Two level;Laundry or work area in basement(has basement, but does not go to basement per patient) Home Equipment: Environmental consultant - 2 wheels;Bedside commode      Prior Function Level of Independence: Independent with assistive  device(s)               Hand Dominance        Extremity/Trunk Assessment   Upper Extremity Assessment Upper Extremity Assessment: Generalized weakness    Lower Extremity Assessment Lower Extremity Assessment: Generalized weakness    Cervical / Trunk Assessment Cervical / Trunk Assessment: Normal  Communication   Communication: No difficulties  Cognition Arousal/Alertness: Awake/alert Behavior During Therapy: WFL for tasks assessed/performed Overall Cognitive Status: Within Functional Limits for tasks assessed                                        General Comments      Exercises     Assessment/Plan    PT Assessment All further PT needs can be met in the next venue of care  PT Problem List Decreased strength;Decreased activity tolerance;Decreased mobility;Decreased balance       PT Treatment Interventions      PT Goals (Current goals can be found in the Care Plan section)  Acute Rehab PT Goals Patient Stated Goal: return home with her daughter to assist PT Goal Formulation: With patient/family Time For Goal Achievement: 06/12/17 Potential to Achieve Goals: Good    Frequency     Barriers to discharge        Co-evaluation               AM-PAC PT "6 Clicks" Daily Activity  Outcome Measure Difficulty turning over in bed (including adjusting bedclothes, sheets and blankets)?: None Difficulty moving from lying on back to sitting on the side of the bed? : None Difficulty sitting down on and standing up from a chair with arms (e.g., wheelchair, bedside commode, etc,.)?: A Little Help needed moving to and from a bed to chair (including a wheelchair)?: A Little Help needed walking in hospital room?: A Little Help needed climbing 3-5 steps with a railing? : A Little 6 Click Score: 20    End of Session   Activity Tolerance: Patient limited by fatigue;Patient tolerated treatment well Patient left: in chair;with call bell/phone within  reach;with family/visitor present Nurse Communication: Mobility status PT Visit Diagnosis: Unsteadiness on feet (R26.81);Other abnormalities of gait and mobility (R26.89);Muscle weakness (generalized) (M62.81)    Time: 5277-8242 PT Time Calculation (min) (ACUTE ONLY): 26 min   Charges:   PT Evaluation $PT Eval Low Complexity: 1 Low PT Treatments $Therapeutic Activity: 23-37 mins   PT G Codes:   PT G-Codes **NOT FOR INPATIENT CLASS** Functional Assessment Tool Used: AM-PAC 6 Clicks Basic Mobility Functional Limitation: Mobility: Walking and moving around Mobility: Walking and Moving Around Current Status (P5361): At least 20 percent but less than 40 percent impaired, limited or restricted Mobility: Walking and Moving Around Goal Status [phone]): At least 20 percent but less than 40 percent impaired, limited or restricted Mobility: Walking and Moving Around Discharge Status [phone]): At least 20 percent but less than 40 percent impaired, limited or restricted    10:46 AM, 06/12/17 Lonell Grandchild, MPT Physical Therapist with [facility] 336 [phone] office [phone] mobile phone

## 2017-06-12 NOTE — Care Management (Signed)
Pt has DVT will DC on Eliquis. Voucher will be provided by RN. CM has called [facility], they DO have Eliquis Starter packs on stock. They close at 6pm today MD aware, will send Rx there.

## 2017-06-12 NOTE — Care Management Obs Status (Signed)
[facility] NOTIFICATION   Patient Details  Name: Sharon Mason MRN: [id number] Date of Birth: February 10, 1924   Medicare Observation Status Notification Given:  Other (see comment)(pt discharged < 24hrs)    Sherald Barge, RN 06/12/2017, 11:13 AM

## 2017-06-12 NOTE — Care Management Note (Signed)
Case Management Note  Patient Details  Name: Sharon Mason MRN: [id number] Date of Birth: [birth year]  Subjective/Objective:       Admitted after fall at home. Pt lives alone but has dtr that lives near-by that helps as needed. She has no HH pta. Uses a RW as needed, has a BSC she uses at night. PT recommends HH PT and pt agreeable. She has no preference of [place] providers. She is agreeable to [facility]. Aware HH has 48 hrs to make fist visit. No new DME needs a this time.              Action/Plan: DC home later today after ECHO. Juliann Pulse, [facility] rep, aware of referral and will pull pt info from chart. No further CM needs noted at this time.   Expected Discharge Date:   06/12/2017               Expected Discharge Plan:  [place]  In-House Referral:  NA  Discharge planning Services  CM Consult  Post Acute Care Choice:  Home Health Choice offered to:  Patient  HH Arranged:  PT [place]:  [place]  Status of Service:  Completed, signed off  Sherald Barge, RN 06/12/2017, 10:59 AM

## 2017-06-13 LAB — URINE CULTURE

## 2017-06-21 DIAGNOSIS — I82401 Acute embolism and thrombosis of unspecified deep veins of right lower extremity: Secondary | ICD-10-CM | POA: Diagnosis not present

## 2017-06-21 DIAGNOSIS — M6281 Muscle weakness (generalized): Secondary | ICD-10-CM | POA: Diagnosis not present

## 2017-06-21 DIAGNOSIS — F419 Anxiety disorder, unspecified: Secondary | ICD-10-CM | POA: Diagnosis not present

## 2017-06-21 DIAGNOSIS — Z7901 Long term (current) use of anticoagulants: Secondary | ICD-10-CM | POA: Diagnosis not present

## 2017-06-21 DIAGNOSIS — I10 Essential (primary) hypertension: Secondary | ICD-10-CM | POA: Diagnosis not present

## 2017-06-21 DIAGNOSIS — Z9181 History of falling: Secondary | ICD-10-CM | POA: Diagnosis not present

## 2017-06-23 DIAGNOSIS — I10 Essential (primary) hypertension: Secondary | ICD-10-CM | POA: Diagnosis not present

## 2017-06-23 DIAGNOSIS — F419 Anxiety disorder, unspecified: Secondary | ICD-10-CM | POA: Diagnosis not present

## 2017-06-23 DIAGNOSIS — M6281 Muscle weakness (generalized): Secondary | ICD-10-CM | POA: Diagnosis not present

## 2017-06-23 DIAGNOSIS — Z7901 Long term (current) use of anticoagulants: Secondary | ICD-10-CM | POA: Diagnosis not present

## 2017-06-23 DIAGNOSIS — Z9181 History of falling: Secondary | ICD-10-CM | POA: Diagnosis not present

## 2017-06-23 DIAGNOSIS — I82401 Acute embolism and thrombosis of unspecified deep veins of right lower extremity: Secondary | ICD-10-CM | POA: Diagnosis not present

## 2017-06-26 DIAGNOSIS — F419 Anxiety disorder, unspecified: Secondary | ICD-10-CM | POA: Diagnosis not present

## 2017-06-26 DIAGNOSIS — Z9181 History of falling: Secondary | ICD-10-CM | POA: Diagnosis not present

## 2017-06-26 DIAGNOSIS — I10 Essential (primary) hypertension: Secondary | ICD-10-CM | POA: Diagnosis not present

## 2017-06-26 DIAGNOSIS — M6281 Muscle weakness (generalized): Secondary | ICD-10-CM | POA: Diagnosis not present

## 2017-06-26 DIAGNOSIS — I82401 Acute embolism and thrombosis of unspecified deep veins of right lower extremity: Secondary | ICD-10-CM | POA: Diagnosis not present

## 2017-06-26 DIAGNOSIS — Z7901 Long term (current) use of anticoagulants: Secondary | ICD-10-CM | POA: Diagnosis not present

## 2017-06-27 DIAGNOSIS — Z1389 Encounter for screening for other disorder: Secondary | ICD-10-CM | POA: Diagnosis not present

## 2017-06-27 DIAGNOSIS — E860 Dehydration: Secondary | ICD-10-CM | POA: Diagnosis not present

## 2017-06-27 DIAGNOSIS — I82401 Acute embolism and thrombosis of unspecified deep veins of right lower extremity: Secondary | ICD-10-CM | POA: Diagnosis not present

## 2017-06-27 DIAGNOSIS — Z681 Body mass index (BMI) 19 or less, adult: Secondary | ICD-10-CM | POA: Diagnosis not present

## 2017-06-28 DIAGNOSIS — Z9181 History of falling: Secondary | ICD-10-CM | POA: Diagnosis not present

## 2017-06-28 DIAGNOSIS — F419 Anxiety disorder, unspecified: Secondary | ICD-10-CM | POA: Diagnosis not present

## 2017-06-28 DIAGNOSIS — M6281 Muscle weakness (generalized): Secondary | ICD-10-CM | POA: Diagnosis not present

## 2017-06-28 DIAGNOSIS — Z7901 Long term (current) use of anticoagulants: Secondary | ICD-10-CM | POA: Diagnosis not present

## 2017-06-28 DIAGNOSIS — I82401 Acute embolism and thrombosis of unspecified deep veins of right lower extremity: Secondary | ICD-10-CM | POA: Diagnosis not present

## 2017-06-28 DIAGNOSIS — I10 Essential (primary) hypertension: Secondary | ICD-10-CM | POA: Diagnosis not present

## 2017-06-30 DIAGNOSIS — Z9181 History of falling: Secondary | ICD-10-CM | POA: Diagnosis not present

## 2017-06-30 DIAGNOSIS — I10 Essential (primary) hypertension: Secondary | ICD-10-CM | POA: Diagnosis not present

## 2017-06-30 DIAGNOSIS — I82401 Acute embolism and thrombosis of unspecified deep veins of right lower extremity: Secondary | ICD-10-CM | POA: Diagnosis not present

## 2017-06-30 DIAGNOSIS — Z7901 Long term (current) use of anticoagulants: Secondary | ICD-10-CM | POA: Diagnosis not present

## 2017-06-30 DIAGNOSIS — F419 Anxiety disorder, unspecified: Secondary | ICD-10-CM | POA: Diagnosis not present

## 2017-06-30 DIAGNOSIS — M6281 Muscle weakness (generalized): Secondary | ICD-10-CM | POA: Diagnosis not present

## 2017-07-03 DIAGNOSIS — Z7901 Long term (current) use of anticoagulants: Secondary | ICD-10-CM | POA: Diagnosis not present

## 2017-07-03 DIAGNOSIS — M6281 Muscle weakness (generalized): Secondary | ICD-10-CM | POA: Diagnosis not present

## 2017-07-03 DIAGNOSIS — I82401 Acute embolism and thrombosis of unspecified deep veins of right lower extremity: Secondary | ICD-10-CM | POA: Diagnosis not present

## 2017-07-03 DIAGNOSIS — Z9181 History of falling: Secondary | ICD-10-CM | POA: Diagnosis not present

## 2017-07-03 DIAGNOSIS — I10 Essential (primary) hypertension: Secondary | ICD-10-CM | POA: Diagnosis not present

## 2017-07-03 DIAGNOSIS — F419 Anxiety disorder, unspecified: Secondary | ICD-10-CM | POA: Diagnosis not present

## 2017-07-05 DIAGNOSIS — M6281 Muscle weakness (generalized): Secondary | ICD-10-CM | POA: Diagnosis not present

## 2017-07-05 DIAGNOSIS — Z9181 History of falling: Secondary | ICD-10-CM | POA: Diagnosis not present

## 2017-07-05 DIAGNOSIS — Z7901 Long term (current) use of anticoagulants: Secondary | ICD-10-CM | POA: Diagnosis not present

## 2017-07-05 DIAGNOSIS — I10 Essential (primary) hypertension: Secondary | ICD-10-CM | POA: Diagnosis not present

## 2017-07-05 DIAGNOSIS — F419 Anxiety disorder, unspecified: Secondary | ICD-10-CM | POA: Diagnosis not present

## 2017-07-05 DIAGNOSIS — I82401 Acute embolism and thrombosis of unspecified deep veins of right lower extremity: Secondary | ICD-10-CM | POA: Diagnosis not present

## 2017-07-07 DIAGNOSIS — F419 Anxiety disorder, unspecified: Secondary | ICD-10-CM | POA: Diagnosis not present

## 2017-07-07 DIAGNOSIS — Z9181 History of falling: Secondary | ICD-10-CM | POA: Diagnosis not present

## 2017-07-07 DIAGNOSIS — M6281 Muscle weakness (generalized): Secondary | ICD-10-CM | POA: Diagnosis not present

## 2017-07-07 DIAGNOSIS — I82401 Acute embolism and thrombosis of unspecified deep veins of right lower extremity: Secondary | ICD-10-CM | POA: Diagnosis not present

## 2017-07-07 DIAGNOSIS — Z7901 Long term (current) use of anticoagulants: Secondary | ICD-10-CM | POA: Diagnosis not present

## 2017-07-07 DIAGNOSIS — I10 Essential (primary) hypertension: Secondary | ICD-10-CM | POA: Diagnosis not present

## 2017-07-10 DIAGNOSIS — Z7901 Long term (current) use of anticoagulants: Secondary | ICD-10-CM | POA: Diagnosis not present

## 2017-07-10 DIAGNOSIS — F419 Anxiety disorder, unspecified: Secondary | ICD-10-CM | POA: Diagnosis not present

## 2017-07-10 DIAGNOSIS — I10 Essential (primary) hypertension: Secondary | ICD-10-CM | POA: Diagnosis not present

## 2017-07-10 DIAGNOSIS — M6281 Muscle weakness (generalized): Secondary | ICD-10-CM | POA: Diagnosis not present

## 2017-07-10 DIAGNOSIS — Z9181 History of falling: Secondary | ICD-10-CM | POA: Diagnosis not present

## 2017-07-10 DIAGNOSIS — I82401 Acute embolism and thrombosis of unspecified deep veins of right lower extremity: Secondary | ICD-10-CM | POA: Diagnosis not present

## 2017-07-13 DIAGNOSIS — M6281 Muscle weakness (generalized): Secondary | ICD-10-CM | POA: Diagnosis not present

## 2017-07-13 DIAGNOSIS — Z7901 Long term (current) use of anticoagulants: Secondary | ICD-10-CM | POA: Diagnosis not present

## 2017-07-13 DIAGNOSIS — I82401 Acute embolism and thrombosis of unspecified deep veins of right lower extremity: Secondary | ICD-10-CM | POA: Diagnosis not present

## 2017-07-13 DIAGNOSIS — F419 Anxiety disorder, unspecified: Secondary | ICD-10-CM | POA: Diagnosis not present

## 2017-07-13 DIAGNOSIS — I10 Essential (primary) hypertension: Secondary | ICD-10-CM | POA: Diagnosis not present

## 2017-07-13 DIAGNOSIS — Z9181 History of falling: Secondary | ICD-10-CM | POA: Diagnosis not present

## 2017-07-14 DIAGNOSIS — I10 Essential (primary) hypertension: Secondary | ICD-10-CM | POA: Diagnosis not present

## 2017-07-14 DIAGNOSIS — F419 Anxiety disorder, unspecified: Secondary | ICD-10-CM | POA: Diagnosis not present

## 2017-07-14 DIAGNOSIS — Z9181 History of falling: Secondary | ICD-10-CM | POA: Diagnosis not present

## 2017-07-14 DIAGNOSIS — M6281 Muscle weakness (generalized): Secondary | ICD-10-CM | POA: Diagnosis not present

## 2017-07-14 DIAGNOSIS — I82401 Acute embolism and thrombosis of unspecified deep veins of right lower extremity: Secondary | ICD-10-CM | POA: Diagnosis not present

## 2017-07-14 DIAGNOSIS — Z7901 Long term (current) use of anticoagulants: Secondary | ICD-10-CM | POA: Diagnosis not present

## 2018-01-04 DIAGNOSIS — Z681 Body mass index (BMI) 19 or less, adult: Secondary | ICD-10-CM | POA: Diagnosis not present

## 2018-01-04 DIAGNOSIS — Z0001 Encounter for general adult medical examination with abnormal findings: Secondary | ICD-10-CM | POA: Diagnosis not present

## 2018-03-28 ENCOUNTER — Emergency Department (HOSPITAL_COMMUNITY): Payer: Medicare Other

## 2018-03-28 ENCOUNTER — Inpatient Hospital Stay
Admission: RE | Admit: 2018-03-28 | Discharge: 2018-03-30 | Disposition: A | Discharge status: Nursing Home (Includes ICF) | Payer: Medicare Other | Source: Ambulatory Visit | Attending: Internal Medicine | Admitting: Internal Medicine

## 2018-03-28 ENCOUNTER — Encounter: Payer: Self-pay | Admitting: Internal Medicine

## 2018-03-28 ENCOUNTER — Other Ambulatory Visit: Payer: Self-pay

## 2018-03-28 ENCOUNTER — Inpatient Hospital Stay
Admission: RE | Admit: 2018-03-28 | Discharge: 2018-03-28 | Disposition: A | Discharge status: Still In Hospital | Payer: Medicare Other | Source: Ambulatory Visit | Attending: Internal Medicine | Admitting: Internal Medicine

## 2018-03-28 ENCOUNTER — Non-Acute Institutional Stay (SKILLED_NURSING_FACILITY): Payer: Medicare Other | Admitting: Internal Medicine

## 2018-03-28 ENCOUNTER — Emergency Department (HOSPITAL_COMMUNITY)
Admission: EM | Admit: 2018-03-28 | Discharge: 2018-03-28 | Disposition: A | Discharge status: Home / Self Care | Payer: Medicare Other | Attending: Emergency Medicine | Admitting: Emergency Medicine

## 2018-03-28 ENCOUNTER — Encounter (HOSPITAL_COMMUNITY): Payer: Self-pay | Admitting: Emergency Medicine

## 2018-03-28 DIAGNOSIS — S199XXA Unspecified injury of neck, initial encounter: Secondary | ICD-10-CM | POA: Diagnosis not present

## 2018-03-28 DIAGNOSIS — S79911A Unspecified injury of right hip, initial encounter: Secondary | ICD-10-CM | POA: Diagnosis present

## 2018-03-28 DIAGNOSIS — I10 Essential (primary) hypertension: Secondary | ICD-10-CM

## 2018-03-28 DIAGNOSIS — Z85828 Personal history of other malignant neoplasm of skin: Secondary | ICD-10-CM | POA: Diagnosis not present

## 2018-03-28 DIAGNOSIS — S32591D Other specified fracture of right pubis, subsequent encounter for fracture with routine healing: Secondary | ICD-10-CM | POA: Insufficient documentation

## 2018-03-28 DIAGNOSIS — Y929 Unspecified place or not applicable: Secondary | ICD-10-CM | POA: Insufficient documentation

## 2018-03-28 DIAGNOSIS — M79621 Pain in right upper arm: Secondary | ICD-10-CM | POA: Diagnosis not present

## 2018-03-28 DIAGNOSIS — R51 Headache: Secondary | ICD-10-CM | POA: Diagnosis not present

## 2018-03-28 DIAGNOSIS — Z7901 Long term (current) use of anticoagulants: Secondary | ICD-10-CM | POA: Diagnosis not present

## 2018-03-28 DIAGNOSIS — I82491 Acute embolism and thrombosis of other specified deep vein of right lower extremity: Secondary | ICD-10-CM | POA: Diagnosis not present

## 2018-03-28 DIAGNOSIS — W1830XA Fall on same level, unspecified, initial encounter: Secondary | ICD-10-CM | POA: Insufficient documentation

## 2018-03-28 DIAGNOSIS — R198 Other specified symptoms and signs involving the digestive system and abdomen: Secondary | ICD-10-CM | POA: Insufficient documentation

## 2018-03-28 DIAGNOSIS — Y999 Unspecified external cause status: Secondary | ICD-10-CM | POA: Insufficient documentation

## 2018-03-28 DIAGNOSIS — S4991XA Unspecified injury of right shoulder and upper arm, initial encounter: Secondary | ICD-10-CM | POA: Diagnosis not present

## 2018-03-28 DIAGNOSIS — Y939 Activity, unspecified: Secondary | ICD-10-CM | POA: Diagnosis not present

## 2018-03-28 DIAGNOSIS — S32501A Unspecified fracture of right pubis, initial encounter for closed fracture: Secondary | ICD-10-CM | POA: Diagnosis not present

## 2018-03-28 DIAGNOSIS — M25511 Pain in right shoulder: Secondary | ICD-10-CM | POA: Insufficient documentation

## 2018-03-28 DIAGNOSIS — S32511A Fracture of superior rim of right pubis, initial encounter for closed fracture: Secondary | ICD-10-CM | POA: Diagnosis not present

## 2018-03-28 DIAGNOSIS — R5381 Other malaise: Secondary | ICD-10-CM | POA: Diagnosis not present

## 2018-03-28 DIAGNOSIS — W19XXXA Unspecified fall, initial encounter: Secondary | ICD-10-CM

## 2018-03-28 DIAGNOSIS — F419 Anxiety disorder, unspecified: Secondary | ICD-10-CM | POA: Diagnosis not present

## 2018-03-28 DIAGNOSIS — S0990XA Unspecified injury of head, initial encounter: Secondary | ICD-10-CM | POA: Diagnosis not present

## 2018-03-28 DIAGNOSIS — Y92009 Unspecified place in unspecified non-institutional (private) residence as the place of occurrence of the external cause: Secondary | ICD-10-CM

## 2018-03-28 LAB — URINALYSIS, ROUTINE W REFLEX MICROSCOPIC
Bilirubin Urine: NEGATIVE
GLUCOSE, UA: NEGATIVE mg/dL
HGB URINE DIPSTICK: NEGATIVE
Ketones, ur: NEGATIVE mg/dL
Leukocytes, UA: NEGATIVE
Nitrite: NEGATIVE
Protein, ur: NEGATIVE mg/dL
SPECIFIC GRAVITY, URINE: 1.008 (ref 1.005–1.030)
pH: 8 (ref 5.0–8.0)

## 2018-03-28 LAB — CBC WITH DIFFERENTIAL/PLATELET
ABS IMMATURE GRANULOCYTES: 0.04 10*3/uL (ref 0.00–0.07)
Basophils Absolute: 0 10*3/uL (ref 0.0–0.1)
Basophils Relative: 0 %
EOS PCT: 2 %
Eosinophils Absolute: 0.2 10*3/uL (ref 0.0–0.5)
HCT: 40.6 % (ref 36.0–46.0)
HEMOGLOBIN: 12.4 g/dL (ref 12.0–15.0)
Immature Granulocytes: 0 %
LYMPHS PCT: 8 %
Lymphs Abs: 0.8 10*3/uL (ref 0.7–4.0)
MCH: 30.5 pg (ref 26.0–34.0)
MCHC: 30.5 g/dL (ref 30.0–36.0)
MCV: 100 fL (ref 80.0–100.0)
MONO ABS: 0.6 10*3/uL (ref 0.1–1.0)
MONOS PCT: 6 %
NEUTROS ABS: 8.6 10*3/uL — AB (ref 1.7–7.7)
Neutrophils Relative %: 84 %
Platelets: 226 10*3/uL (ref 150–400)
RBC: 4.06 MIL/uL (ref 3.87–5.11)
RDW: 13.8 % (ref 11.5–15.5)
WBC: 10.2 10*3/uL (ref 4.0–10.5)
nRBC: 0 % (ref 0.0–0.2)

## 2018-03-28 LAB — COMPREHENSIVE METABOLIC PANEL
ALBUMIN: 4 g/dL (ref 3.5–5.0)
ALT: 19 U/L (ref 0–44)
AST: 24 U/L (ref 15–41)
Alkaline Phosphatase: 72 U/L (ref 38–126)
Anion gap: 8 (ref 5–15)
BILIRUBIN TOTAL: 0.7 mg/dL (ref 0.3–1.2)
BUN: 15 mg/dL (ref 8–23)
CO2: 31 mmol/L (ref 22–32)
CREATININE: 0.88 mg/dL (ref 0.44–1.00)
Calcium: 10 mg/dL (ref 8.9–10.3)
Chloride: 102 mmol/L (ref 98–111)
GFR calc Af Amer: >60 mL/min (ref >60)
GFR calc non Af Amer: 55 mL/min — ABNORMAL LOW (ref >60)
GLUCOSE: 99 mg/dL (ref 70–99)
POTASSIUM: 3.8 mmol/L (ref 3.5–5.1)
Sodium: 141 mmol/L (ref 135–145)
Total Protein: 7.5 g/dL (ref 6.5–8.1)

## 2018-03-28 LAB — CBG MONITORING, ED: Glucose-Capillary: 119 mg/dL — ABNORMAL HIGH (ref 70–99)

## 2018-03-28 LAB — CK: Total CK: 67 U/L (ref 38–234)

## 2018-03-28 MED ORDER — POLYVINYL ALCOHOL 1.4 % OP SOLN
1.0000 [drp] | Freq: Every day | OPHTHALMIC | Status: DC | PRN
  Filled 2018-03-28: qty 15

## 2018-03-28 MED ORDER — OXYCODONE-ACETAMINOPHEN 5-325 MG PO TABS
1.0000 | ORAL_TABLET | Freq: Four times a day (QID) | ORAL | Status: DC | PRN
  Administered 2018-03-28: 1 via ORAL
  Filled 2018-03-28: qty 1

## 2018-03-28 MED ORDER — ALPRAZOLAM 0.5 MG PO TABS: 0.2500 mg | ORAL_TABLET | Freq: Three times a day (TID) | ORAL | Status: DC | PRN

## 2018-03-28 MED ORDER — OXYCODONE-ACETAMINOPHEN 5-325 MG PO TABS: 1.0000 | ORAL_TABLET | Freq: Four times a day (QID) | ORAL | 0 refills | Status: DC | PRN

## 2018-03-28 MED ORDER — SENNOSIDES-DOCUSATE SODIUM 8.6-50 MG PO TABS: 1.0000 | ORAL_TABLET | Freq: Two times a day (BID) | ORAL | 0 refills | Status: AC

## 2018-03-28 NOTE — ED Notes (Signed)
Per PT pt stood and took a few steps,  Pt sat in chair and slumped over.  PT moved pt back to the stretcher.  Upon entering room pt is pale and clammy but awake ,alert and answering questions.  VS wnl.  EDP notified.

## 2018-03-28 NOTE — Plan of Care (Signed)
  Problem: Acute Rehab PT Goals(only PT should resolve) Goal: Pt Will Go Supine/Side To Sit Outcome: Progressing Flowsheets (Taken 03/28/2018 1428) Pt will go Supine/Side to Sit: with moderate assist Goal: Pt Will Transfer Bed To Chair/Chair To Bed Outcome: Progressing Flowsheets (Taken 03/28/2018 1428) Pt will Transfer Bed to Chair/Chair to Bed: with mod assist Goal: Pt Will Ambulate Outcome: Progressing Flowsheets (Taken 03/28/2018 1428) Pt will Ambulate: 25 feet; with moderate assist; with rolling walker Goal: Patient Will Transfer Sit To/From Stand Outcome: Progressing Flowsheets (Taken 03/28/2018 1428) Patient will transfer sit to/from stand: with moderate assist   2:28 PM, 03/28/18 Lonell Grandchild, MPT Physical Therapist with [facility] [phone] office [phone] mobile phone

## 2018-03-28 NOTE — Clinical Social Work Note (Signed)
Daughter, Mrs. De Nurse, states that they are interested in going to [facility] private pay.     Curly Mackowski, Clydene Pugh, LCSW

## 2018-03-28 NOTE — Clinical Social Work Note (Signed)
Daughter, Ms. Fargis, accepts [facility] private pay bed offer. She was advised that she would have to pay $5700 prior to patient's admission today. She was also advised that this does not include charges for PT and medications. She was also advised to bring any supplies such as adult incontinence briefs that patient utilized. Ms. De Nurse was agreeable.   LCSW informed Nurse.   LCSW signing off.

## 2018-03-28 NOTE — ED Notes (Signed)
Pt c/o right hip and arm pain

## 2018-03-28 NOTE — Evaluation (Signed)
Physical Therapy Evaluation Patient Details Name: Sharon Mason MRN: [id number] DOB: 1923-08-06 Today's Date: 03/28/2018   History of Present Illness  TENNELLE TAFLINGER is a 84 y/o female s/p fall with right pubic rami fracture with history of HTN, anxiety  Clinical Impression  Patient is functioning well below baseline, demonstrates slow labored movement for sitting up at bedside, limited for taking steps in room due to poor standing balance, c/o increased pain right hip/pelvic area with weightbearing, frequent near falls and had syncopal episode before being put back to bed - RN/MD notified.  Patient will benefit from continued physical therapy in hospital and recommended venue below to increase strength, balance, endurance for safe ADLs and gait.    Follow Up Recommendations SNF    Equipment Recommendations  None recommended by PT    Recommendations for Other Services       Precautions / Restrictions Precautions Precautions: Fall Restrictions Weight Bearing Restrictions: No      Mobility  Bed Mobility Overal bed mobility: Needs Assistance Bed Mobility: Supine to Sit;Sit to Supine     Supine to sit: Mod assist;Max assist Sit to supine: Max assist   General bed mobility comments: slow labored movement with difficulty moving RLE  Transfers Overall transfer level: Needs assistance Equipment used: Rolling walker (2 wheeled) Transfers: Sit to/from [facility] Sit to Stand: Mod assist Stand pivot transfers: Mod assist;Max assist          Ambulation/Gait Ambulation/Gait assistance: Max assist Gait Distance (Feet): 8 Feet Assistive device: Rolling walker (2 wheeled) Gait Pattern/deviations: Decreased step length - right;Decreased step length - left;Decreased stance time - right;Decreased stride length Gait velocity: slow   General Gait Details: limited to 8-10 steps at bedside with poor tolerance for weightbearing on RLE due to increased pain, frequent  near loss of balance  Stairs            Wheelchair Mobility    Modified Rankin (Stroke Patients Only)       Balance Overall balance assessment: Needs assistance Sitting-balance support: Bilateral upper extremity supported;Feet supported Sitting balance-Leahy Scale: Fair Sitting balance - Comments: fair/poor leaning on hands   Standing balance support: Bilateral upper extremity supported;During functional activity Standing balance-Leahy Scale: Poor Standing balance comment: using RW                             Pertinent Vitals/Pain Pain Assessment: Faces Faces Pain Scale: Hurts even more Pain Location: right hip Pain Descriptors / Indicators: Discomfort;Grimacing;Guarding;[place] expects to be discharged to:: Private residence Living Arrangements: Alone   Type of Home: House Home Access: [place]: One [place]: Environmental consultant - 2 wheels;Bedside commode      Prior Function Level of Independence: Needs assistance   Gait / Transfers Assistance Needed: Household ambulator with RW  ADL's / Homemaking Assistance Needed: assisted by her daughter        Hand Dominance        Extremity/Trunk Assessment   Upper Extremity Assessment Upper Extremity Assessment: Generalized weakness    Lower Extremity Assessment Lower Extremity Assessment: Generalized weakness;RLE deficits/detail;LLE deficits/detail RLE Deficits / Details: grossly -3/5 LLE Deficits / Details: grossly -4/5    Cervical / Trunk Assessment Cervical / Trunk Assessment: Normal  Communication   Communication: No difficulties  Cognition Arousal/Alertness: Awake/alert Behavior During Therapy: WFL for tasks assessed/performed Overall Cognitive Status: Within Functional Limits for  tasks assessed                                        General Comments      Exercises     Assessment/Plan    PT Assessment  Patient needs continued PT services  PT Problem List Decreased strength;Decreased activity tolerance;Decreased balance;Decreased mobility       PT Treatment Interventions Gait training;Stair training;Functional mobility training;Therapeutic activities;Therapeutic exercise;Patient/family education    PT Goals (Current goals can be found in the Care Plan section)  Acute Rehab PT Goals Patient Stated Goal: return home PT Goal Formulation: With patient/family Time For Goal Achievement: 04/11/18 Potential to Achieve Goals: Good    Frequency Min 3X/week   Barriers to discharge        Co-evaluation               AM-PAC PT "6 Clicks" Daily Activity  Outcome Measure Difficulty turning over in bed (including adjusting bedclothes, sheets and blankets)?: A Lot Difficulty moving from lying on back to sitting on the side of the bed? : A Lot Difficulty sitting down on and standing up from a chair with arms (e.g., wheelchair, bedside commode, etc,.)?: A Lot Help needed moving to and from a bed to chair (including a wheelchair)?: A Lot Help needed walking in hospital room?: A Lot Help needed climbing 3-5 steps with a railing? : Total 6 Click Score: 11    End of Session Equipment Utilized During Treatment: Gait belt Activity Tolerance: Patient limited by fatigue;Patient limited by lethargy;Patient limited by pain Patient left: in bed;with nursing/sitter in room;with family/visitor present Nurse Communication: Mobility status PT Visit Diagnosis: Unsteadiness on feet (R26.81);Other abnormalities of gait and mobility (R26.89);Muscle weakness (generalized) (M62.81);History of falling (Z91.81)    Time: 0930-1001 PT Time Calculation (min) (ACUTE ONLY): 31 min   Charges:   PT Evaluation $PT Eval Moderate Complexity: 1 Mod PT Treatments $Therapeutic Activity: 23-37 mins        2:25 PM, 03/28/18 Lonell Grandchild, MPT Physical Therapist with [facility] [phone]  office [phone] mobile phone

## 2018-03-28 NOTE — Clinical Social Work Note (Signed)
Clinical Social Work Assessment  Patient Details  Name: Sharon Mason MRN: [id number] Date of Birth: [birth year]  Date of referral:  03/28/18               Reason for consult:  Facility Placement                Permission sought to share information with:    Permission granted to share information::     Name::        Agency::     Relationship::     Contact Information:  Daughter, Berdine Addison  Housing/Transportation Living arrangements for the past 2 months:  Single Family Home Source of Information:  Adult Children Patient Interpreter Needed:  None Criminal Activity/Legal Involvement Pertinent to Current Situation/Hospitalization:  No - Comment as needed Significant Relationships:  Adult Children Lives with:  Self Do you feel safe going back to the place where you live?  No Need for family participation in patient care:  Yes (Comment)  Care giving concerns:  Patient is relatively independent at baseline.   Social Worker assessment / plan:  Patient ambulates with a walker, takes sponge baths. She prepares simple meals such as cereal and juice, boiled egg and sandwiches for herself. Her daughter, Berdine Addison, brings her dinner. Her daughter also does most of the house keeping.  LCSW discussed Medicare requirements for SNF.  LCSW discussed private pay as an option.  She states that patient may be able to afford private pay if she helped her.  She stated that are not seeking long term care.   Ms. De Nurse stated that she would wait to see what the doctor says. LCSW provided contact information for Ms. Fargis to contact once she has made a decision.   Employment status:  Retired Nurse, adult PT Recommendations:  [place] / Referral to community resources:  [place]  Patient/Family's Response to care:  Family is considering options.   Patient/Family's Understanding of and Emotional Response to Diagnosis, Current  Treatment, and Prognosis:  Patient's daughter understands patient's diagnosis, treatment and prognosis.   Emotional Assessment Appearance:  Appears stated age Attitude/Demeanor/Rapport:    Affect (typically observed):  Calm Orientation:  Oriented to Self, Oriented to Place, Oriented to  Time, Oriented to Situation Alcohol / Substance use:  Not Applicable Psych involvement (Current and /or in the community):  No (Comment)  Discharge Needs  Concerns to be addressed:  Discharge Planning Concerns Readmission within the last 30 days:  No Current discharge risk:  Lives alone Barriers to Discharge:  No Barriers Identified   Ihor Gully, LCSW 03/28/2018, 11:52 AM

## 2018-03-28 NOTE — ED Notes (Signed)
Pt scared to urinate, even though pt has purewick.   RN asked to bladder scan.   Pt stomach distended. Pt has 7106ml + after bladder scan.   Will in and out cath.

## 2018-03-28 NOTE — Progress Notes (Signed)
This is an acute visit.  Level care skilled.  Facility is [facility].  Chief complaint acute visit status post fall with resulting right pelvic fracture.  History of present illness.  Patient is a 84 year old female who went to the emergency room early this morning via EMS- apparently she had fallen at some point the night before.  She apparently he was unable to get help for several hours she does live alone although she does have a very supportive daughter who lives nearby.  She complained of diffuse pain including right shoulder and right hip.  Her blood pressure also was elevated at 210/81 I suspect her pain was contributing to this.  In the ER a sense of imaging was done including her right shoulder cervical spine she had a CT of her head.  And cervical spine  All these were negative for any acute process however x-ray of her hip pelvis did show an acute posttraumatic fracture of the right superior and inferior pubic rami.  Apparently she was given oxycodone for pain which apparently helped some but she did have some increased confusion apparently.  It was thought she would benefit from short-term rehab and skilled nursing apparently she was hesitant at first but eventually did agree.  She was discharged with orders for Tylenol which she is on routinely and also on naproxen twice daily-she  has the oxycodone -- Percocet order every 8 hours as needed as needed for severe pain but I have spoken with nursing of trying to minimize its use  Currently she appears to be resting in bed comfortably-her blood pressure has stabilized I got 134/70- she appears to be comfortable as long she is not moving.  Her other diagnoses include hypertension she is on low-dose Norvasc.  As well as anxiety she has Xanax 3 times daily as needed.  I do note she also is on Eliquis somewhat sketchy information here apparently she was started on this in late December for DVT in her right leg thought  secondary to immobility after a fall- apparently she had been on this for a few months but had stopped taking it-it appears per med list this has been restarted-.  I did discuss this with her daughter--I explained that her mother is at risk I suspect with  relative immobility for another DVT- will await Dr. Steve Rattler input on this tomorrow as well about aggressiveness of anticoagulation treatment.  Her daughter denies her mom has any history of rectal bleeding other than apparently bleeding hemorrhoids at times.  Past Medical History:  Diagnosis Date  . Anxiety   . Headache   . Hypertension         Patient Active Problem List   Diagnosis Date Noted  . Right leg DVT ([place]) 06/12/2017  . Fall 06/11/2017  . Essential hypertension 06/11/2017  . Mild dehydration 07/25/2016  . Generalized weakness 07/25/2016  . Elevated troponin 07/24/2016         Past Surgical History:  Procedure Laterality Date  . EYE SURGERY    . SKIN BIOPSY    . skin cancer removal                          OB History    Gravida      Para      Term      Preterm      AB      Living  1     SAB  TAB      Ectopic      Multiple      Live Births               Home Medications               Patient states she takes a "nerve pill", they deny that she is still on the Eliquis, she states she takes Tylenol for pain         MEDICATIONS s   Medication Sig Start Date End Date Taking? Authorizing Provider  acetaminophen (TYLENOL) 500 MG tablet Take 1,000 mg by mouth every 8 (six)  .     [provider]  ALPRAZolam Duanne Moron) 0.25 MG tablet Take 0.25 mg by mouth 3 (three) times daily as needed for anxiety.    [provider]  amLODipine (NORVASC) 5 MG tablet Take 1 tablet (5 mg total) by mouth daily. 06/13/17   Kathie Dike, MD  apixaban (ELIQUIS) 5 MG TABS tablet Take 10mg  po bid for 7 days then 5mg  po bid 06/12/17   Kathie Dike, MD    carbamide peroxide (DEBROX) 6.5 % OTIC solution Place 5 drops into both ears daily as needed.    [provider]  Carboxymethylcellul-Glycerin (CLEAR EYES FOR DRY EYES) 1-0.25 % SOLN Place 1-2 drops into both eyes daily as needed.    [provider]  naproxen sodium (ANAPROX) 220 MG tablet Take 220 mg by mouth 2 (two) times daily with a meal.    [provider]  polyethylene glycol (MIRALAX / GLYCOLAX) packet Take 17 g by mouth daily as needed.    [provider]  shark liver oil-cocoa butter (PREPARATION H) 0.25-88.44 % suppository Place 1 suppository rectally as needed for hemorrhoids.    [provider]    Family History No family history on file.  Social History Social History       Tobacco Use  . Smoking status: Never Smoker  . Smokeless tobacco: Never Used  Substance Use Topics  . Alcohol use: No  . Drug use: No  lives at home Lives alone   Allergies              Aspirin and Neosporin [neomycin-bacitracin zn-polymyx]   Review of Systems  General she is not complaining of fever or chills.  Skin does not complain of itching or rashes or diaphoresis.  Head ears eyes nose mouth and throat she is not complaining of visual changes to me.  Respiratory denies shortness of breath or increased cough.  Cardiac does not complain of chest pain or significant lower extremity edema.  GI is not complaining of abdominal pain currently.  Musculoskeletal does complain of some right hip pelvic discomfort more with movement but appears more comfortable than she did earlier.  Neurologic does not complain of numbness headache dizziness.  Psych does not appear overtly anxious or depressed at this time she has had a long day she states but does not appear overtly distressed or anxious.  Physical exam.  Temperature is 96.8 pulse 87 respirations 20 blood pressure taken manually 134/70.  In general this is a somewhat  frail-appearing elderly female in no distress lying in bed.  Her skin is warm and dry she does have numerous solar induced changes most prominently of her face.  Eyes she has prescription lenses sclera and conjunctive appear clear.  Oropharynx is clear mucous membranes moist.  Chest is clear to auscultation with somewhat shallow air entry.  Heart is regular  rate and rhythm without murmur gallop or rub she does not have significant lower extremity edema pedal pulses are intact as well as capillary refill.  Abdomen is somewhat protuberant but soft nontender with positive bowel sounds.  Musculoskeletal Limited exam since she is in bed but is able to move her right extremities with limitations of her right shoulder and arm because of some discomfort-is able to move her left arm with more flexibility.  She does have lower extremity weakness again with significant limitations of her right lower extremity with the pelvic fracture.  Neurologic is grossly intact her speech is clear she is alert.  Psych she appears grossly oriented but is not speaking much appears to be tired- she is pleasant and appropriate when asked simple questions and simple maneuvers are asked of her.  Labs.  March 28, 2018.  Sodium 141 potassium 3.8 BUN 15 creatinine 0.88.  WBC 10.2 hemoglobin 12.4 platelets 226.  Assessment and plan.  1.  History of right pubic fracture-again emphasis will be on pain control with therapy she will need orthopedic follow-up-she does have Tylenol routinely for pain as well as naproxen twice daily-she also has Percocet if needed but we are trying to minimize its use secondary to confusion concerns.  2.  Hypertension this was significantly elevated in the ER I suspect secondary to pain this has come down she is on low-dose Norvasc at this point will continue to monitor.  3.  Anxiety apparently this is been somewhat persistent she does have PRN Xanax 3 times a day.  4.  Previous  history of right lower extremity DVT this was in December 2018- at this point she is on Eliquis again 10 mg twice daily for 7 days and then to be reduced to 5 mg twice daily- apparently she did complete her initial Eliquis course sometime ago- will await Dr. Steve Rattler input on need to continue this   [id number]-of note greater than 35 minutes spent assessing patient reviewing her chart and labs discussing her status with her daughter at bedside-coordinating and formulating a plan of care- of note greater than 50% of time spent coordinating plan of care

## 2018-03-28 NOTE — ED Triage Notes (Signed)
Pt comes via [facility]. Pt lives at home alone. Pt fell at an unknown time and unwitnessed. Pt called her granddaughter at sometime last night and got in touch with the grandson who came to pts house and picked her up out of the floor at an unknown time last night. Pt C/O "pain all over." Pt states she thinks her hip is broken.

## 2018-03-28 NOTE — NC FL2 (Signed)
[place] LEVEL OF CARE SCREENING TOOL     IDENTIFICATION  Patient Name: Sharon Mason Birthdate: 1924-02-25 Sex: female Admission Date (Current Location): 03/28/2018  [facility] and [place] Number:  [employer] and Address:  [place] [address], [place]      Provider Number: [id number]  Attending Physician Name and Address:  Virgel Manifold, MD  Relative Name and Phone Number:       Current Level of Care: Other (Comment)(Emergency Department ) Recommended Level of Care: [place] Prior Approval Number:    Date Approved/Denied:   PASRR Number: [id number]  Discharge Plan: SNF    Current Diagnoses: Patient Active Problem List   Diagnosis Date Noted  . Right leg DVT ([place]) 06/12/2017  . Fall 06/11/2017  . Essential hypertension 06/11/2017  . Mild dehydration 07/25/2016  . Generalized weakness 07/25/2016  . Elevated troponin 07/24/2016    Orientation RESPIRATION BLADDER Height & Weight     Self, Time, Situation, Place  Normal Continent Weight: 100 lb (45.4 kg) Height:  5\' 2"  (157.5 cm)  BEHAVIORAL SYMPTOMS/MOOD NEUROLOGICAL BOWEL NUTRITION STATUS      Continent Diet  AMBULATORY STATUS COMMUNICATION OF NEEDS Skin   Extensive Assist Verbally Normal                       Personal Care Assistance Level of Assistance  Bathing, Feeding, Dressing Bathing Assistance: Limited assistance Feeding assistance: Independent Dressing Assistance: Limited assistance     Functional Limitations Info  Sight, Hearing, Speech Sight Info: Adequate Hearing Info: Adequate Speech Info: Adequate    SPECIAL CARE FACTORS FREQUENCY  PT (By licensed PT)     PT Frequency: 5x/week              Contractures Contractures Info: Not present    Additional Factors Info  Allergies, Psychotropic   Allergies Info: Aspirin, Neosporin,  Psychotropic Info: Xanax          Current Medications (03/28/2018):  This  is the current hospital active medication list Current Facility-Administered Medications  Medication Dose Route Frequency Provider Last Rate Last Dose  . ALPRAZolam (XANAX) tablet 0.25 mg  0.25 mg Oral TID PRN Rolland Porter, MD      . oxyCODONE-acetaminophen (PERCOCET/ROXICET) 5-325 MG per tablet 1 tablet  1 tablet Oral Q6H PRN Rolland Porter, MD   1 tablet at 03/28/18 0834  . polyvinyl alcohol (LIQUIFILM TEARS) 1.4 % ophthalmic solution 1-2 drop  1-2 drop Both Eyes Daily PRN Rolland Porter, MD       Current Outpatient Medications  Medication Sig Dispense Refill  . acetaminophen (TYLENOL) 500 MG tablet Take 1,000 mg by mouth every 6 (six) hours as needed.     . ALPRAZolam (XANAX) 0.25 MG tablet Take 0.25 mg by mouth 3 (three) times daily as needed for anxiety.    . bisacodyl (FLEET) 10 MG/30ML ENEM Place 10 mg rectally once.    . carbamide peroxide (DEBROX) 6.5 % OTIC solution Place 5 drops into both ears daily as needed.    . Carboxymethylcellul-Glycerin (CLEAR EYES FOR DRY EYES) 1-0.25 % SOLN Place 1-2 drops into both eyes daily as needed.    . naproxen sodium (ANAPROX) 220 MG tablet Take 220 mg by mouth 2 (two) times daily with a meal.    . polyethylene glycol (MIRALAX / GLYCOLAX) packet Take 17 g by mouth daily as needed.    . shark liver oil-cocoa butter (PREPARATION H)  0.25-88.44 % suppository Place 1 suppository rectally as needed for hemorrhoids.    Marland Kitchen amLODipine (NORVASC) 5 MG tablet Take 1 tablet (5 mg total) by mouth daily. (Patient not taking: Reported on 03/28/2018) 30 tablet 0  . apixaban (ELIQUIS) 5 MG TABS tablet Take 10mg  po bid for 7 days then 5mg  po bid (Patient not taking: Reported on 03/28/2018) 60 tablet 2     Discharge Medications: Please see discharge summary for a list of discharge medications.  Relevant Imaging Results:  Relevant Lab Results:   Additional Information SSN [id number] Clydene Pugh, LCSW

## 2018-03-28 NOTE — ED Provider Notes (Signed)
[facility] EMERGENCY DEPARTMENT Provider Note   CSN: [id number] Arrival date & time: 03/28/18  0532  Time seen 05:40 AM   History   Chief Complaint Chief Complaint  Patient presents with  . Fall   Level 5 caveat for age  HPI Sharon Mason is a 84 y.o. female.  HPI patient presents to the emergency department via EMS.  She told me she fell the light before last and her grandson got her up.  However her daughter entered the room later and said she had fallen this past night.  Patient states she normally goes to bed around 9 and she fell when she was getting ready to go to bed.  She was unable to get help for several hours.  She thinks she hit her head and states she has pain on top of her head.  She also complains of pain in the right shoulder and her right hip.  But unfortunately during her exam everywhere I touched her hurt.  She states she has been unable to walk since she fell.  PCP [name], MD   Past Medical History:  Diagnosis Date  . Anxiety   . Headache   . Hypertension     Patient Active Problem List   Diagnosis Date Noted  . Right leg DVT ([place]) 06/12/2017  . Fall 06/11/2017  . Essential hypertension 06/11/2017  . Mild dehydration 07/25/2016  . Generalized weakness 07/25/2016  . Elevated troponin 07/24/2016    Past Surgical History:  Procedure Laterality Date  . EYE SURGERY    . SKIN BIOPSY    . skin cancer removal       OB History    Gravida      Para      Term      Preterm      AB      Living  1     SAB      TAB      Ectopic      Multiple      Live Births               Home Medications    Patient states she takes a "nerve pill", they deny that she is still on the Eliquis, she states she takes Tylenol for pain  Prior to Admission medications   Medication Sig Start Date End Date Taking? Authorizing Provider  acetaminophen (TYLENOL) 500 MG tablet Take 1,000 mg by mouth every 6 (six) hours as needed.     [provider]  ALPRAZolam Duanne Moron) 0.25 MG tablet Take 0.25 mg by mouth 3 (three) times daily as needed for anxiety.    [provider]  amLODipine (NORVASC) 5 MG tablet Take 1 tablet (5 mg total) by mouth daily. 06/13/17   Kathie Dike, MD  apixaban (ELIQUIS) 5 MG TABS tablet Take 10mg  po bid for 7 days then 5mg  po bid 06/12/17   Kathie Dike, MD  carbamide peroxide (DEBROX) 6.5 % OTIC solution Place 5 drops into both ears daily as needed.    [provider]  Carboxymethylcellul-Glycerin (CLEAR EYES FOR DRY EYES) 1-0.25 % SOLN Place 1-2 drops into both eyes daily as needed.    [provider]  naproxen sodium (ANAPROX) 220 MG tablet Take 220 mg by mouth 2 (two) times daily with a meal.    [provider]  polyethylene glycol (MIRALAX / GLYCOLAX) packet Take 17 g by mouth daily as needed.    [provider]  shark liver oil-cocoa butter (PREPARATION H) 0.25-88.44 % suppository Place 1 suppository rectally as needed for hemorrhoids.    [provider]    Family History No family history on file.  Social History Social History   Tobacco Use  . Smoking status: Never Smoker  . Smokeless tobacco: Never Used  Substance Use Topics  . Alcohol use: No  . Drug use: No  lives at home Lives alone   Allergies   Aspirin and Neosporin [neomycin-bacitracin zn-polymyx]   Review of Systems Review of Systems  Unable to perform ROS: Age     Physical Exam Updated Vital Signs BP (!) 210/81 (BP Location: Left Arm)   Pulse 64   Temp 98.5 F (36.9 C) (Oral)   Resp 18   Ht 5\' 2"  (1.575 m)   Wt 45.4 kg   SpO2 96%   BMI 18.29 kg/m   Physical Exam  Constitutional: She is oriented to person, place, and time.  Non-toxic appearance. She does not appear ill. No distress.  Frail elderly female  HENT:  Head: Normocephalic and atraumatic.  Right Ear: External ear normal.  Left Ear: External ear normal.  Nose: Nose normal. No mucosal  edema or rhinorrhea.  Mouth/Throat: Oropharynx is clear and moist and mucous membranes are normal. No dental abscesses or uvula swelling.  Eyes: Pupils are equal, round, and reactive to light. Conjunctivae and EOM are normal.  Patient has no eyelashes, her lids are slightly reddened  Neck: Normal range of motion and full passive range of motion without pain. Neck supple.  Cardiovascular: Normal rate, regular rhythm and normal heart sounds. Exam reveals no gallop and no friction rub.  No murmur heard. Pulmonary/Chest: Effort normal and breath sounds normal. No respiratory distress. She has no wheezes. She has no rhonchi. She has no rales. She exhibits no tenderness and no crepitus.  Abdominal: Soft. Normal appearance and bowel sounds are normal. She exhibits no distension. There is no tenderness. There is no rebound and no guarding.  Musculoskeletal: Normal range of motion. She exhibits tenderness. She exhibits no edema.  Patient unfortunately seems to hurt everywhere I touch her.  However when I examine her shoulder I do not feel any obvious deformity.  She also does not have any swelling around her elbow or forearms or wrists.  I do not see any shortening or internal or external rotation of her extremities.  She is tender diffusely in her whole leg on the right, but she states it hurts more in her hip.  Neurological: She is alert and oriented to person, place, and time. She has normal strength. No cranial nerve deficit.  Skin: Skin is warm, dry and intact. No rash noted. No erythema. No pallor.  Psychiatric: She has a normal mood and affect. Her speech is normal and behavior is normal. Her mood appears not anxious.  Nursing note and vitals reviewed.    ED Treatments / Results  Labs (all labs ordered are listed, but only abnormal results are displayed) Results for orders placed or performed during the hospital encounter of 03/28/18  Comprehensive metabolic panel  Result Value Ref Range    Sodium 141 135 - 145 mmol/L   Potassium 3.8 3.5 - 5.1 mmol/L   Chloride 102 98 - 111 mmol/L   CO2 31 22 - 32 mmol/L   Glucose, Bld 99 70 - 99 mg/dL   BUN 15 8 - 23 mg/dL   Creatinine, Ser 0.88 0.44 - 1.00 mg/dL   Calcium 10.0 8.9 -  10.3 mg/dL   Total Protein 7.5 6.5 - 8.1 g/dL   Albumin 4.0 3.5 - 5.0 g/dL   AST 24 15 - 41 U/L   ALT 19 0 - 44 U/L   Alkaline Phosphatase 72 38 - 126 U/L   Total Bilirubin 0.7 0.3 - 1.2 mg/dL   GFR calc non Af Amer 55 (L) >60 mL/min   GFR calc Af Amer >60 >60 mL/min   Anion gap 8 5 - 15  CBC with Differential  Result Value Ref Range   WBC 10.2 4.0 - 10.5 K/uL   RBC 4.06 3.87 - 5.11 MIL/uL   Hemoglobin 12.4 12.0 - 15.0 g/dL   HCT 40.6 36.0 - 46.0 %   MCV 100.0 80.0 - 100.0 fL   MCH 30.5 26.0 - 34.0 pg   MCHC 30.5 30.0 - 36.0 g/dL   RDW 13.8 11.5 - 15.5 %   Platelets 226 150 - 400 K/uL   nRBC 0.0 0.0 - 0.2 %   Neutrophils Relative % 84 %   Neutro Abs 8.6 (H) 1.7 - 7.7 K/uL   Lymphocytes Relative 8 %   Lymphs Abs 0.8 0.7 - 4.0 K/uL   Monocytes Relative 6 %   Monocytes Absolute 0.6 0.1 - 1.0 K/uL   Eosinophils Relative 2 %   Eosinophils Absolute 0.2 0.0 - 0.5 K/uL   Basophils Relative 0 %   Basophils Absolute 0.0 0.0 - 0.1 K/uL   Immature Granulocytes 0 %   Abs Immature Granulocytes 0.04 0.00 - 0.07 K/uL  Urinalysis, Routine w reflex microscopic  Result Value Ref Range   Color, Urine STRAW (A) YELLOW   APPearance CLEAR CLEAR   Specific Gravity, Urine 1.008 1.005 - 1.030   pH 8.0 5.0 - 8.0   Glucose, UA NEGATIVE NEGATIVE mg/dL   Hgb urine dipstick NEGATIVE NEGATIVE   Bilirubin Urine NEGATIVE NEGATIVE   Ketones, ur NEGATIVE NEGATIVE mg/dL   Protein, ur NEGATIVE NEGATIVE mg/dL   Nitrite NEGATIVE NEGATIVE   Leukocytes, UA NEGATIVE NEGATIVE  CK  Result Value Ref Range   Total CK 67 38 - 234 U/L   Laboratory interpretation all normal     EKG None  Radiology Dg Shoulder Right  Result Date: 03/28/2018 CLINICAL DATA:  Pain after a  fall. EXAM: RIGHT SHOULDER - 2+ VIEW COMPARISON:  None. FINDINGS: Diffuse bone demineralization. Degenerative changes in the right shoulder involving the glenohumeral joint. No evidence of acute fracture or subluxation. No focal bone lesion or bone destruction. Bone cortex and trabecular architecture appear intact. No radiopaque soft tissue foreign bodies. IMPRESSION: Degenerative changes in the right shoulder. No acute bony abnormalities. Electronically Signed   By: Lucienne Capers M.D.   On: 03/28/2018 07:03   Ct Head Wo Contrast  Ct Cervical Spine Wo Contrast  Result Date: 03/28/2018 CLINICAL DATA:  Unwitnessed fall at the 9 time. Pain all over. Struck back of head. EXAM: CT HEAD WITHOUT CONTRAST CT CERVICAL SPINE WITHOUT CONTRAST TECHNIQUE: Multidetector CT imaging of the head and cervical spine was performed following the standard protocol without intravenous contrast. Multiplanar CT image reconstructions of the cervical spine were also generated. COMPARISON:  06/11/2017 FINDINGS: CT HEAD FINDINGS Brain: Diffuse cerebral atrophy. Ventricular dilatation consistent with central atrophy. Low-attenuation changes in the deep white matter consistent with small vessel ischemic changes and old lacunar infarcts. No mass effect or midline shift. No abnormal extra-axial fluid collections. Gray-white matter junctions are distinct. Basal cisterns are not effaced. No acute intracranial hemorrhage. Vascular: Moderate  intracranial vascular calcifications. Skull: Calvarium appears intact. No depressed skull fractures. Sinuses/Orbits: There is complete opacification of the left maxillary antrum with increased density centrally. This could represent chronic sinusitis or perhaps fungal sinusitis. No acute air-fluid levels. Mastoid air cells are clear. Other: None. CT CERVICAL SPINE FINDINGS Alignment: Normal alignment of the cervical vertebrae and facet joints. C1-2 articulation appears intact. Skull base and vertebrae:  Skull base appears intact. No vertebral compression deformities. No focal bone lesions or bone destruction. Soft tissues and spinal canal: No prevertebral soft tissue swelling. No abnormal paraspinal soft tissue mass or infiltration. Disc levels: Degenerative changes throughout the cervical spine with narrowed interspaces and prominent endplate hypertrophic changes throughout. Degenerative changes throughout the facet joints and at C1-2. Upper chest: Lung apices are clear. Vascular calcifications. Other: None. IMPRESSION: 1. No acute intracranial abnormalities. Chronic atrophy and small vessel ischemic changes. 2. Normal alignment of the cervical spine. Diffuse degenerative changes. No acute displaced fractures identified. Electronically Signed   By: Lucienne Capers M.D.   On: 03/28/2018 06:53   Dg Humerus Right  Result Date: 03/28/2018 CLINICAL DATA:  Pain after a fall. EXAM: RIGHT HUMERUS - 2+ VIEW COMPARISON:  None. FINDINGS: Diffuse bone demineralization. Right humerus appears intact. No evidence of acute fracture or subluxation. No focal bone lesion or bone destruction. Bone cortex and trabecular architecture appear intact. Soft tissues are unremarkable. No radiopaque soft tissue foreign bodies. IMPRESSION: Diffuse bone demineralization. No acute bony abnormalities. Electronically Signed   By: Lucienne Capers M.D.   On: 03/28/2018 07:03   Dg Hip Unilat W Or Wo Pelvis 2-3 Views Right  Result Date: 03/28/2018 CLINICAL DATA:  Patient fell at home today. Right shoulder and arm pain, lower back pain, and bilateral hip pain. EXAM: DG HIP (WITH OR WITHOUT PELVIS) 2-3V RIGHT COMPARISON:  06/11/2017 FINDINGS: Diffuse bone demineralization. Visualization of the sacrum and upper pelvis is limited due to bowel gas and stool. Degenerative changes in both hips. Fractures of the right superior and inferior pubic ramus with minimal displacement. No evidence of acute fracture or dislocation of the right hip. Vascular  calcifications. IMPRESSION: Acute posttraumatic fractures of the right superior and inferior pubic rami. Diffuse bone demineralization. Electronically Signed   By: Lucienne Capers M.D.   On: 03/28/2018 07:02    Procedures Procedures (including critical care time)  Medications Ordered in ED Medications  ALPRAZolam (XANAX) tablet 0.25 mg (has no administration in time range)  Carboxymethylcellul-Glycerin 1-0.25 % SOLN 1-2 drop (has no administration in time range)  oxyCODONE-acetaminophen (PERCOCET/ROXICET) 5-325 MG per tablet 1 tablet (has no administration in time range)     Initial Impression / Assessment and Plan / ED Course  I have reviewed the triage vital signs and the nursing notes.  Pertinent labs & imaging results that were available during my care of the patient were reviewed by me and considered in my medical decision making (see chart for details).     When I basically did was order radiology studies on the place that she states hurt the most, this would be her right shoulder, right hip, and her head.  She also had some tenderness of her neck to palpation so C-spine was added.  7:15 AM I talked to the patient and her family member.  Patient is unable to bear weight.  Her family had given her tramadol at home without relief.  She states she has a walker but she still unable to walk.  Patient has reluctantly agreed to be admitted  for rehab until she is able to ambulate and go home.  Social work consult was initiated to find nursing home placement, PT, as ordered she was started on Percocet for her fracture pain.  Final Clinical Impressions(s) / ED Diagnoses   Final diagnoses:  Fall in home, initial encounter  Closed fracture of right pubis, unspecified portion of pubis, initial encounter ([place])    Disposition pending  Rolland Porter, MD, Barbette Or, MD 03/28/18 [phone]

## 2018-03-29 ENCOUNTER — Non-Acute Institutional Stay (SKILLED_NURSING_FACILITY): Payer: Medicare Other | Admitting: Internal Medicine

## 2018-03-29 ENCOUNTER — Other Ambulatory Visit (HOSPITAL_COMMUNITY)
Admission: AD | Admit: 2018-03-29 | Discharge: 2018-03-29 | Disposition: A | Discharge status: Home / Self Care | Payer: Medicare Other | Source: Skilled Nursing Facility | Attending: Internal Medicine | Admitting: Internal Medicine

## 2018-03-29 ENCOUNTER — Encounter: Payer: Self-pay | Admitting: Internal Medicine

## 2018-03-29 DIAGNOSIS — E860 Dehydration: Secondary | ICD-10-CM | POA: Diagnosis not present

## 2018-03-29 DIAGNOSIS — I10 Essential (primary) hypertension: Secondary | ICD-10-CM

## 2018-03-29 DIAGNOSIS — F411 Generalized anxiety disorder: Secondary | ICD-10-CM | POA: Diagnosis not present

## 2018-03-29 DIAGNOSIS — R410 Disorientation, unspecified: Secondary | ICD-10-CM | POA: Diagnosis not present

## 2018-03-29 DIAGNOSIS — Z9181 History of falling: Secondary | ICD-10-CM | POA: Diagnosis not present

## 2018-03-29 DIAGNOSIS — W19XXXS Unspecified fall, sequela: Secondary | ICD-10-CM

## 2018-03-29 DIAGNOSIS — I82491 Acute embolism and thrombosis of other specified deep vein of right lower extremity: Secondary | ICD-10-CM

## 2018-03-29 DIAGNOSIS — S32591D Other specified fracture of right pubis, subsequent encounter for fracture with routine healing: Secondary | ICD-10-CM | POA: Diagnosis not present

## 2018-03-29 DIAGNOSIS — R51 Headache: Secondary | ICD-10-CM | POA: Diagnosis not present

## 2018-03-29 DIAGNOSIS — R279 Unspecified lack of coordination: Secondary | ICD-10-CM | POA: Diagnosis not present

## 2018-03-29 DIAGNOSIS — R296 Repeated falls: Secondary | ICD-10-CM | POA: Diagnosis not present

## 2018-03-29 DIAGNOSIS — R2681 Unsteadiness on feet: Secondary | ICD-10-CM | POA: Diagnosis not present

## 2018-03-29 DIAGNOSIS — M6281 Muscle weakness (generalized): Secondary | ICD-10-CM | POA: Diagnosis not present

## 2018-03-29 LAB — URINALYSIS, ROUTINE W REFLEX MICROSCOPIC
Bilirubin Urine: NEGATIVE
Glucose, UA: NEGATIVE mg/dL
Ketones, ur: 5 mg/dL — AB
Nitrite: NEGATIVE
PROTEIN: 100 mg/dL — AB
SPERM UA: NONE SEEN
Specific Gravity, Urine: 1.019 (ref 1.005–1.030)
pH: 5 (ref 5.0–8.0)

## 2018-03-29 NOTE — Progress Notes (Signed)
Provider:  Veleta Miners MD Location:    [facility] Room Number: 140/W Place of Service:  SNF (31)  PCP: [name], MD Patient Care Team: [name], MD as PCP - General [facility] Medicine)  Extended Emergency Contact Information Primary Emergency Contact: Fargis,Brenda Address: [address] [place] [address] [place] Phone: [phone] Relation: Daughter  Code Status: Full Code Goals of Care: Advanced Directive information Advanced Directives 03/29/2018  Does Patient Have a Medical Advance Directive? Yes  Type of Advance Directive (No Data)  Does patient want to make changes to medical advance directive? No - Patient declined  Would patient like information on creating a medical advance directive? -      Chief Complaint  Patient presents with  . New Admit To SNF    New Admission Visit    HPI: Patient is a 84 y.o. female seen today for admission to SNF for Therapy.  She has h/o Hypertension and Superficial DVT Diagnosed in 12/18 Patient was brought to ED after fall at home. She fell while getting in the Bed. IT seems she was on the floor for few hours before her Yolanda Bonine found her. She was found to have Closed Fracture of Right Pubis both Superior and Inferior Rami. She was unable to put any weight on her legs. She was send to SNF for Therapy . In Facility she has been very confused. Per nurses she is not Emptying her bladder. She also tries to get out of bed and is at very high risk of Fall. Patient unable to give me any detail history. It looks like she was hallucinating. I Did talk to her daughter and per her she does better at home and is not this confused. She lives by herself but he daughter checks on her everyday. She walks with the walker and was independent in her ADLs most of the time.  Past Medical History:  Diagnosis Date  . Anxiety   . Headache   . Hypertension    Past Surgical  History:  Procedure Laterality Date  . EYE SURGERY    . SKIN BIOPSY    . skin cancer removal      reports that she has never smoked. She has never used smokeless tobacco. She reports that she does not drink alcohol or use drugs. Social History   Socioeconomic History  . Marital status: Widowed    Spouse name: Not on file  . Number of children: Not on file  . Years of education: Not on file  . Highest education level: Not on file  Occupational History  . Not on file  Social Needs  . Financial resource strain: Not on file  . Food insecurity:    Worry: Not on file    Inability: Not on file  . Transportation needs:    Medical: Not on file    Non-medical: Not on file  Tobacco Use  . Smoking status: Never Smoker  . Smokeless tobacco: Never Used  Substance and Sexual Activity  . Alcohol use: No  . Drug use: No  . Sexual activity: Not on file  Lifestyle  . Physical activity:    Days per week: Not on file    Minutes per session: Not on file  . Stress: Not on file  Relationships  . Social connections:    Talks on phone: Not on file    Gets together: Not on file  Attends religious service: Not on file    Active member of club or organization: Not on file    Attends meetings of clubs or organizations: Not on file    Relationship status: Not on file  . Intimate partner violence:    Fear of current or ex partner: Not on file    Emotionally abused: Not on file    Physically abused: Not on file    Forced sexual activity: Not on file  Other Topics Concern  . Not on file  Social History Narrative  . Not on file    Functional Status Survey:    History reviewed. No pertinent family history.  Health Maintenance  Topic Date Due  . TETANUS/TDAP  08/31/1942  . DEXA SCAN  09/01/1990  . PNA vac Low Risk Adult (1 of 2 - PCV13) 09/01/1990  . INFLUENZA VACCINE  01/18/2018    Allergies  Allergen Reactions  . Aspirin Nausea Only  . Neosporin [Neomycin-Bacitracin Zn-Polymyx]  Rash    Outpatient Encounter Medications as of 03/29/2018  Medication Sig  . acetaminophen (TYLENOL) 500 MG tablet Take 1,000 mg by mouth every 6 (six) hours as needed.   . ALPRAZolam (XANAX) 0.25 MG tablet Take 0.25 mg by mouth 3 (three) times daily as needed for anxiety.  Marland Kitchen amLODipine (NORVASC) 5 MG tablet Take 1 tablet (5 mg total) by mouth daily.  Marland Kitchen apixaban (ELIQUIS) 5 MG TABS tablet Take 10mg  po bid for 7 days then 5mg  po bid  . carbamide peroxide (DEBROX) 6.5 % OTIC solution Place 5 drops into both ears daily as needed.  . Carboxymethylcellul-Glycerin (CLEAR EYES FOR DRY EYES) 1-0.25 % SOLN Place 1-2 drops into both eyes daily as needed.  . naproxen sodium (ANAPROX) 220 MG tablet Take 220 mg by mouth 2 (two) times daily with a meal.  . oxyCODONE-acetaminophen (PERCOCET/ROXICET) 5-325 MG tablet Take 1 tablet by mouth every 6 (six) hours as needed for severe pain.  . polyethylene glycol (MIRALAX / GLYCOLAX) packet Take 17 g by mouth daily as needed.  . senna-docusate (SENOKOT-S) 8.6-50 MG tablet Take 1 tablet by mouth 2 (two) times daily.  . shark liver oil-cocoa butter (PREPARATION H) 0.25-88.44 % suppository Place 1 suppository rectally as needed for hemorrhoids.  . [DISCONTINUED] bisacodyl (FLEET) 10 MG/30ML ENEM Place 10 mg rectally once.   No facility-administered encounter medications on file as of 03/29/2018.      Review of Systems  Unable to perform ROS: Other    Vitals:   03/29/18 1042  BP: (!) 163/78  Pulse: 92  Resp: 18  Temp: (!) 97.4 F (36.3 C)  TempSrc: Oral  SpO2: 93%   There is no height or weight on file to calculate BMI. Physical Exam  Constitutional: She appears well-developed and well-nourished.  HENT:  Head: Normocephalic.  Mouth/Throat: Oropharynx is clear and moist.  Eyes: Pupils are equal, round, and reactive to light.  Neck: Neck supple.  Cardiovascular: Normal rate and regular rhythm.  No murmur heard. Pulmonary/Chest: Effort normal and  breath sounds normal. No stridor. No respiratory distress. She has no wheezes.  Abdominal: Soft. Bowel sounds are normal. She exhibits no distension. There is no tenderness. There is no guarding.  Musculoskeletal: She exhibits no edema.  Neurological: She is alert.  Did know she is [place] but does not remember being in hospital.Or Falling. Did not know the Date or Year. Was moving all Extremities. Did seem like she was Hallucinating.  Skin: Skin is warm and dry.  Psychiatric:  She has a normal mood and affect. Her behavior is normal. Thought content normal.    Labs reviewed: Basic Metabolic Panel: Recent Labs    06/11/17 1050 06/12/17 0501 03/28/18 0631  NA 142 143 141  K 3.7 4.0 3.8  CL 103 108 102  CO2 29 25 31   GLUCOSE 86 70 99  BUN 17 19 15   CREATININE 0.88 0.93 0.88  CALCIUM 9.9 9.1 10.0   Liver Function Tests: Recent Labs    06/11/17 1050 03/28/18 0631  AST 24 24  ALT 14 19  ALKPHOS 69 72  BILITOT 0.6 0.7  PROT 7.0 7.5  ALBUMIN 3.7 4.0   No results for input(s): LIPASE, AMYLASE in the last 8760 hours. No results for input(s): AMMONIA in the last 8760 hours. CBC: Recent Labs    06/11/17 1050 06/12/17 0501 03/28/18 0631  WBC 8.2 7.4 10.2  NEUTROABS 6.7  --  8.6*  HGB 11.9* 10.8* 12.4  HCT 39.3 34.9* 40.6  MCV 99.7 100.0 100.0  PLT 264 258 226   Cardiac Enzymes: Recent Labs    06/11/17 1050 03/28/18 0631  CKTOTAL 256* 67  TROPONINI 0.03*  --    BNP: Invalid input(s): POCBNP No results found for: HGBA1C Lab Results  Component Value Date   TSH 2.413 07/25/2016   No results found for: VITAMINB12 No results found for: FOLATE No results found for: IRON, TIBC, FERRITIN  Imaging and Procedures obtained prior to SNF admission: Dg Shoulder Right  Result Date: 03/28/2018 CLINICAL DATA:  Pain after a fall. EXAM: RIGHT SHOULDER - 2+ VIEW COMPARISON:  None. FINDINGS: Diffuse bone demineralization. Degenerative changes in the right shoulder involving  the glenohumeral joint. No evidence of acute fracture or subluxation. No focal bone lesion or bone destruction. Bone cortex and trabecular architecture appear intact. No radiopaque soft tissue foreign bodies. IMPRESSION: Degenerative changes in the right shoulder. No acute bony abnormalities. Electronically Signed   By: Lucienne Capers M.D.   On: 03/28/2018 07:03   Ct Head Wo Contrast  Result Date: 03/28/2018 CLINICAL DATA:  Unwitnessed fall at the 9 time. Pain all over. Struck back of head. EXAM: CT HEAD WITHOUT CONTRAST CT CERVICAL SPINE WITHOUT CONTRAST TECHNIQUE: Multidetector CT imaging of the head and cervical spine was performed following the standard protocol without intravenous contrast. Multiplanar CT image reconstructions of the cervical spine were also generated. COMPARISON:  06/11/2017 FINDINGS: CT HEAD FINDINGS Brain: Diffuse cerebral atrophy. Ventricular dilatation consistent with central atrophy. Low-attenuation changes in the deep white matter consistent with small vessel ischemic changes and old lacunar infarcts. No mass effect or midline shift. No abnormal extra-axial fluid collections. Gray-white matter junctions are distinct. Basal cisterns are not effaced. No acute intracranial hemorrhage. Vascular: Moderate intracranial vascular calcifications. Skull: Calvarium appears intact. No depressed skull fractures. Sinuses/Orbits: There is complete opacification of the left maxillary antrum with increased density centrally. This could represent chronic sinusitis or perhaps fungal sinusitis. No acute air-fluid levels. Mastoid air cells are clear. Other: None. CT CERVICAL SPINE FINDINGS Alignment: Normal alignment of the cervical vertebrae and facet joints. C1-2 articulation appears intact. Skull base and vertebrae: Skull base appears intact. No vertebral compression deformities. No focal bone lesions or bone destruction. Soft tissues and spinal canal: No prevertebral soft tissue swelling. No  abnormal paraspinal soft tissue mass or infiltration. Disc levels: Degenerative changes throughout the cervical spine with narrowed interspaces and prominent endplate hypertrophic changes throughout. Degenerative changes throughout the facet joints and at C1-2. Upper chest: Lung apices are  clear. Vascular calcifications. Other: None. IMPRESSION: 1. No acute intracranial abnormalities. Chronic atrophy and small vessel ischemic changes. 2. Normal alignment of the cervical spine. Diffuse degenerative changes. No acute displaced fractures identified. Electronically Signed   By: Lucienne Capers M.D.   On: 03/28/2018 06:53   Ct Cervical Spine Wo Contrast  Result Date: 03/28/2018 CLINICAL DATA:  Unwitnessed fall at the 9 time. Pain all over. Struck back of head. EXAM: CT HEAD WITHOUT CONTRAST CT CERVICAL SPINE WITHOUT CONTRAST TECHNIQUE: Multidetector CT imaging of the head and cervical spine was performed following the standard protocol without intravenous contrast. Multiplanar CT image reconstructions of the cervical spine were also generated. COMPARISON:  06/11/2017 FINDINGS: CT HEAD FINDINGS Brain: Diffuse cerebral atrophy. Ventricular dilatation consistent with central atrophy. Low-attenuation changes in the deep white matter consistent with small vessel ischemic changes and old lacunar infarcts. No mass effect or midline shift. No abnormal extra-axial fluid collections. Gray-white matter junctions are distinct. Basal cisterns are not effaced. No acute intracranial hemorrhage. Vascular: Moderate intracranial vascular calcifications. Skull: Calvarium appears intact. No depressed skull fractures. Sinuses/Orbits: There is complete opacification of the left maxillary antrum with increased density centrally. This could represent chronic sinusitis or perhaps fungal sinusitis. No acute air-fluid levels. Mastoid air cells are clear. Other: None. CT CERVICAL SPINE FINDINGS Alignment: Normal alignment of the cervical  vertebrae and facet joints. C1-2 articulation appears intact. Skull base and vertebrae: Skull base appears intact. No vertebral compression deformities. No focal bone lesions or bone destruction. Soft tissues and spinal canal: No prevertebral soft tissue swelling. No abnormal paraspinal soft tissue mass or infiltration. Disc levels: Degenerative changes throughout the cervical spine with narrowed interspaces and prominent endplate hypertrophic changes throughout. Degenerative changes throughout the facet joints and at C1-2. Upper chest: Lung apices are clear. Vascular calcifications. Other: None. IMPRESSION: 1. No acute intracranial abnormalities. Chronic atrophy and small vessel ischemic changes. 2. Normal alignment of the cervical spine. Diffuse degenerative changes. No acute displaced fractures identified. Electronically Signed   By: Lucienne Capers M.D.   On: 03/28/2018 06:53   Dg Humerus Right  Result Date: 03/28/2018 CLINICAL DATA:  Pain after a fall. EXAM: RIGHT HUMERUS - 2+ VIEW COMPARISON:  None. FINDINGS: Diffuse bone demineralization. Right humerus appears intact. No evidence of acute fracture or subluxation. No focal bone lesion or bone destruction. Bone cortex and trabecular architecture appear intact. Soft tissues are unremarkable. No radiopaque soft tissue foreign bodies. IMPRESSION: Diffuse bone demineralization. No acute bony abnormalities. Electronically Signed   By: Lucienne Capers M.D.   On: 03/28/2018 07:03   Dg Hip Unilat W Or Wo Pelvis 2-3 Views Right  Result Date: 03/28/2018 CLINICAL DATA:  Patient fell at home today. Right shoulder and arm pain, lower back pain, and bilateral hip pain. EXAM: DG HIP (WITH OR WITHOUT PELVIS) 2-3V RIGHT COMPARISON:  06/11/2017 FINDINGS: Diffuse bone demineralization. Visualization of the sacrum and upper pelvis is limited due to bowel gas and stool. Degenerative changes in both hips. Fractures of the right superior and inferior pubic ramus with  minimal displacement. No evidence of acute fracture or dislocation of the right hip. Vascular calcifications. IMPRESSION: Acute posttraumatic fractures of the right superior and inferior pubic rami. Diffuse bone demineralization. Electronically Signed   By: Lucienne Capers M.D.   On: 03/28/2018 07:02    Assessment/Plan Mental Status Changes Patient labs in ED did not show any acute Changes. Her CT scan in ED was negative for any acute Process. Will repeat UA Also Discontinue  Oxycodone Lower the Dose of Xanax. Urinary Retension Patient is unable to micturate in diaper. And holding her Urine.per nurses. Will try Flomax for 1 week to see if it helps patient  S/P Pelvic Rami Fracture Tylenol PRN Therapy. Hypertension Will continue Norvasc Per daughter Patient was not taking her Amlodipine at home Her BP Is mildly Elevated here. ? Superficial DVT in 12/18 Per daughter Patient did take her Eliquis for 3 months. Not sure why she is on it now. Will discontinue it especially since her daughter says she has h/o Recurrent Falls.  Family/ staff Communication:   Labs/tests ordered: Total time spent in this patient care encounter was 45_ minutes; greater than 50% of the visit spent counseling patient, reviewing records , Labs and coordinating care for problems addressed at this encounter.

## 2018-03-30 ENCOUNTER — Emergency Department (HOSPITAL_COMMUNITY): Payer: Medicare Other

## 2018-03-30 ENCOUNTER — Emergency Department (HOSPITAL_COMMUNITY)
Admission: EM | Admit: 2018-03-30 | Discharge: 2018-03-30 | Disposition: A | Discharge status: Skilled Nursing Facility | Payer: Medicare Other | Attending: Emergency Medicine | Admitting: Emergency Medicine

## 2018-03-30 ENCOUNTER — Other Ambulatory Visit: Payer: Self-pay

## 2018-03-30 ENCOUNTER — Inpatient Hospital Stay
Admission: RE | Admit: 2018-03-30 | Discharge: 2018-04-16 | Disposition: A | Discharge status: Home / Self Care | Payer: Medicare Other | Source: Ambulatory Visit | Attending: Internal Medicine | Admitting: Internal Medicine

## 2018-03-30 ENCOUNTER — Encounter: Payer: Self-pay | Admitting: Internal Medicine

## 2018-03-30 ENCOUNTER — Non-Acute Institutional Stay (SKILLED_NURSING_FACILITY): Payer: Medicare Other | Admitting: Internal Medicine

## 2018-03-30 ENCOUNTER — Encounter (HOSPITAL_COMMUNITY): Payer: Self-pay | Admitting: Emergency Medicine

## 2018-03-30 DIAGNOSIS — S32591D Other specified fracture of right pubis, subsequent encounter for fracture with routine healing: Secondary | ICD-10-CM | POA: Diagnosis not present

## 2018-03-30 DIAGNOSIS — Z79899 Other long term (current) drug therapy: Secondary | ICD-10-CM | POA: Insufficient documentation

## 2018-03-30 DIAGNOSIS — R4182 Altered mental status, unspecified: Secondary | ICD-10-CM | POA: Insufficient documentation

## 2018-03-30 DIAGNOSIS — E860 Dehydration: Secondary | ICD-10-CM | POA: Diagnosis not present

## 2018-03-30 DIAGNOSIS — R1031 Right lower quadrant pain: Secondary | ICD-10-CM | POA: Diagnosis not present

## 2018-03-30 DIAGNOSIS — S3210XA Unspecified fracture of sacrum, initial encounter for closed fracture: Secondary | ICD-10-CM | POA: Diagnosis not present

## 2018-03-30 DIAGNOSIS — Z7901 Long term (current) use of anticoagulants: Secondary | ICD-10-CM | POA: Insufficient documentation

## 2018-03-30 DIAGNOSIS — R279 Unspecified lack of coordination: Secondary | ICD-10-CM | POA: Diagnosis not present

## 2018-03-30 DIAGNOSIS — R0902 Hypoxemia: Secondary | ICD-10-CM | POA: Insufficient documentation

## 2018-03-30 DIAGNOSIS — N390 Urinary tract infection, site not specified: Secondary | ICD-10-CM | POA: Insufficient documentation

## 2018-03-30 DIAGNOSIS — R531 Weakness: Secondary | ICD-10-CM | POA: Diagnosis present

## 2018-03-30 DIAGNOSIS — I10 Essential (primary) hypertension: Secondary | ICD-10-CM | POA: Diagnosis not present

## 2018-03-30 DIAGNOSIS — J189 Pneumonia, unspecified organism: Secondary | ICD-10-CM | POA: Diagnosis not present

## 2018-03-30 DIAGNOSIS — Z85828 Personal history of other malignant neoplasm of skin: Secondary | ICD-10-CM | POA: Insufficient documentation

## 2018-03-30 DIAGNOSIS — Z9181 History of falling: Secondary | ICD-10-CM | POA: Diagnosis not present

## 2018-03-30 DIAGNOSIS — M6281 Muscle weakness (generalized): Secondary | ICD-10-CM | POA: Diagnosis not present

## 2018-03-30 DIAGNOSIS — R296 Repeated falls: Secondary | ICD-10-CM | POA: Diagnosis not present

## 2018-03-30 LAB — CBC WITH DIFFERENTIAL/PLATELET
Abs Immature Granulocytes: 0.04 10*3/uL (ref 0.00–0.07)
Basophils Absolute: 0 10*3/uL (ref 0.0–0.1)
Basophils Relative: 0 %
EOS PCT: 1 %
Eosinophils Absolute: 0.1 10*3/uL (ref 0.0–0.5)
HEMATOCRIT: 55.9 % — AB (ref 36.0–46.0)
HEMOGLOBIN: 17.3 g/dL — AB (ref 12.0–15.0)
Immature Granulocytes: 0 %
LYMPHS ABS: 0.6 10*3/uL — AB (ref 0.7–4.0)
LYMPHS PCT: 6 %
MCH: 30.1 pg (ref 26.0–34.0)
MCHC: 30.9 g/dL (ref 30.0–36.0)
MCV: 97.2 fL (ref 80.0–100.0)
MONO ABS: 0.4 10*3/uL (ref 0.1–1.0)
MONOS PCT: 4 %
NEUTROS ABS: 8.8 10*3/uL — AB (ref 1.7–7.7)
Neutrophils Relative %: 89 %
Platelets: 193 10*3/uL (ref 150–400)
RBC: 5.75 MIL/uL — ABNORMAL HIGH (ref 3.87–5.11)
RDW: 14.1 % (ref 11.5–15.5)
WBC: 9.9 10*3/uL (ref 4.0–10.5)
nRBC: 0 % (ref 0.0–0.2)

## 2018-03-30 LAB — BASIC METABOLIC PANEL
Anion gap: 10 (ref 5–15)
BUN: 28 mg/dL — AB (ref 8–23)
CHLORIDE: 100 mmol/L (ref 98–111)
CO2: 29 mmol/L (ref 22–32)
CREATININE: 1.09 mg/dL — AB (ref 0.44–1.00)
Calcium: 10 mg/dL (ref 8.9–10.3)
GFR calc Af Amer: 49 mL/min — ABNORMAL LOW (ref >60)
GFR calc non Af Amer: 43 mL/min — ABNORMAL LOW (ref >60)
Glucose, Bld: 89 mg/dL (ref 70–99)
POTASSIUM: 3.8 mmol/L (ref 3.5–5.1)
Sodium: 139 mmol/L (ref 135–145)

## 2018-03-30 LAB — I-STAT CG4 LACTIC ACID, ED: LACTIC ACID, VENOUS: 1.22 mmol/L (ref 0.5–1.9)

## 2018-03-30 MED ORDER — OXYCODONE-ACETAMINOPHEN 5-325 MG PO TABS: 1.0000 | ORAL_TABLET | Freq: Four times a day (QID) | ORAL | 0 refills | Status: DC | PRN

## 2018-03-30 MED ORDER — THIAMINE HCL 100 MG/ML IJ SOLN
100.0000 mg | Freq: Once | INTRAMUSCULAR | Status: AC
  Administered 2018-03-30: 100 mg via INTRAVENOUS
  Filled 2018-03-30: qty 2

## 2018-03-30 MED ORDER — IOPAMIDOL (ISOVUE-300) INJECTION 61%
80.0000 mL | Freq: Once | INTRAVENOUS | Status: AC | PRN
  Administered 2018-03-30: 80 mL via INTRAVENOUS

## 2018-03-30 MED ORDER — SODIUM CHLORIDE 0.9 % IV SOLN
INTRAVENOUS | Status: DC | PRN
  Administered 2018-03-30: 16:00:00 via INTRAVENOUS

## 2018-03-30 MED ORDER — SODIUM CHLORIDE 0.9 % IV SOLN
500.0000 mg | Freq: Once | INTRAVENOUS | Status: AC
  Administered 2018-03-30: 500 mg via INTRAVENOUS
  Filled 2018-03-30: qty 500

## 2018-03-30 MED ORDER — ALPRAZOLAM 0.25 MG PO TABS: 0.2500 mg | ORAL_TABLET | Freq: Three times a day (TID) | ORAL | 0 refills | Status: DC | PRN

## 2018-03-30 MED ORDER — SODIUM CHLORIDE 0.9 % IV SOLN
1.0000 g | Freq: Once | INTRAVENOUS | Status: AC
  Administered 2018-03-30: 1 g via INTRAVENOUS
  Filled 2018-03-30: qty 10

## 2018-03-30 MED ORDER — SODIUM CHLORIDE 0.9 % IV BOLUS
1000.0000 mL | Freq: Once | INTRAVENOUS | Status: AC
  Administered 2018-03-30: 1000 mL via INTRAVENOUS

## 2018-03-30 NOTE — ED Notes (Signed)
Per MD, leave IV in when discharged to [facility]. She also needs to go home on oxygen

## 2018-03-30 NOTE — ED Triage Notes (Signed)
Per pt family pt has not slept in two days. Last night per patient she slept. Per daughter pt is at [facility] for fall and broken pelvis on right side. Pt this am was harder to arouse per [facility] staff. No sedative medication was given last night per staff documentation. Pt is alert on arrival and moans with pain due to movement.

## 2018-03-30 NOTE — ED Provider Notes (Signed)
[facility] EMERGENCY DEPARTMENT Provider Note   CSN: [id number] Arrival date & time: 03/30/18  1110     History   Chief Complaint Chief Complaint  Patient presents with  . Weakness    HPI Sharon Mason is a 84 y.o. female.  All 5 caveat altered mental status history is obtained from records accompanying patient, from epic and from patient's daughter who accompanies her.  HPI She has been less responsive for the past 2 days.  She was seen here 2 days ago for a fall resulting in fractured pelvis.  She had CT scans of head and cervical spine which were normal.  She was transferred from here to the [facility].  A urinalysis was obtained yesterday.  Culture pending urinalysis consistent with urinary tract infection.  She presents here today as she was "seeing things yesterday" per her daughter and somewhat agitated she has been more sleepy today.   Past Medical History:  Diagnosis Date  . Anxiety   . Headache   . Hypertension     Patient Active Problem List   Diagnosis Date Noted  . Closed fracture of multiple pubic rami, right, with routine healing, subsequent encounter 03/28/2018  . Right leg DVT ([place]) 06/12/2017  . Fall 06/11/2017  . Essential hypertension 06/11/2017  . Mild dehydration 07/25/2016  . Generalized weakness 07/25/2016  . Elevated troponin 07/24/2016    Past Surgical History:  Procedure Laterality Date  . EYE SURGERY    . SKIN BIOPSY    . skin cancer removal       OB History    Gravida      Para      Term      Preterm      AB      Living  1     SAB      TAB      Ectopic      Multiple      Live Births               Home Medications    Prior to Admission medications   Medication Sig Start Date End Date Taking? Authorizing Provider  acetaminophen (TYLENOL) 500 MG tablet Take 1,000 mg by mouth every 8 (eight) hours as needed.    Yes [provider]  amLODipine (NORVASC) 5 MG tablet Take 1 tablet (5 mg total) by mouth  daily. 06/13/17  Yes Kathie Dike, MD  apixaban (ELIQUIS) 5 MG TABS tablet Take 10mg  po bid for 7 days then 5mg  po bid 06/12/17  Yes Memon, Jolaine Artist, MD  carbamide peroxide (DEBROX) 6.5 % OTIC solution Place 5 drops into both ears daily as needed.   Yes [provider]  Carboxymethylcellul-Glycerin (CLEAR EYES FOR DRY EYES) 1-0.25 % SOLN Place 1-2 drops into both eyes daily as needed.   Yes [provider]  oxyCODONE-acetaminophen (PERCOCET/ROXICET) 5-325 MG tablet Take 1 tablet by mouth every 6 (six) hours as needed for severe pain.   Yes [provider]  polyethylene glycol (MIRALAX / GLYCOLAX) packet Take 17 g by mouth daily as needed.   Yes [provider]  senna-docusate (SENOKOT-S) 8.6-50 MG tablet Take 1 tablet by mouth 2 (two) times daily. 03/28/18  Yes Virgel Manifold, MD  shark liver oil-cocoa butter (PREPARATION H) 0.25-88.44 % suppository Place 1 suppository rectally as needed for hemorrhoids.   Yes [provider]  ALPRAZolam (XANAX) 0.25 MG tablet Take 0.25 mg by mouth 3 (three) times daily as needed for anxiety.  [provider]   No Longer on Eliquis Family History History reviewed. No pertinent family history.  Social History Social History   Tobacco Use  . Smoking status: Never Smoker  . Smokeless tobacco: Never Used  Substance Use Topics  . Alcohol use: No  . Drug use: No   DO NOT RESUSCITATE CODE STATUS  Allergies   Aspirin and Neosporin [neomycin-bacitracin zn-polymyx]   Review of Systems Review of Systems  Unable to perform ROS: Mental status change     Physical Exam Updated Vital Signs BP (!) 158/74 (BP Location: Left Arm)   Pulse 84   Temp 100.3 F (37.9 C) (Rectal)   Resp 18   SpO2 94%   Physical Exam  Constitutional:  Chronically ill, frail appearing  HENT:  Head: Normocephalic and atraumatic.  Eyes: Pupils are equal, round, and reactive to light. Conjunctivae are normal.  Neck: Neck  supple. No tracheal deviation present. No thyromegaly present.  Cardiovascular: Normal rate and regular rhythm.  No murmur heard. Pulmonary/Chest: Effort normal and breath sounds normal.  Abdominal: Soft. Bowel sounds are normal. She exhibits no distension. There is tenderness.  Tender right lower quadrants bilaterally  Musculoskeletal: Normal range of motion. She exhibits no edema or tenderness.  Neurological: Coordination normal.  Arouses to gentle tactile stimulus with opening eyes and purposeful movement  Skin: Skin is warm and dry. No rash noted.  Nursing note and vitals reviewed.  Results for orders placed or performed during the hospital encounter of 03/30/18  Blood culture (routine x 2)  Result Value Ref Range   Specimen Description BLOOD LEFT ARM    Special Requests      BOTTLES DRAWN AEROBIC AND ANAEROBIC Blood Culture adequate volume Performed at [facility], [address], [place], [place] [address]    Culture PENDING    Report Status PENDING   CBC with Differential/Platelet  Result Value Ref Range   WBC 9.9 4.0 - 10.5 K/uL   RBC 5.75 (H) 3.87 - 5.11 MIL/uL   Hemoglobin 17.3 (H) 12.0 - 15.0 g/dL   HCT 55.9 (H) 36.0 - 46.0 %   MCV 97.2 80.0 - 100.0 fL   MCH 30.1 26.0 - 34.0 pg   MCHC 30.9 30.0 - 36.0 g/dL   RDW 14.1 11.5 - 15.5 %   Platelets 193 150 - 400 K/uL   nRBC 0.0 0.0 - 0.2 %   Neutrophils Relative % 89 %   Neutro Abs 8.8 (H) 1.7 - 7.7 K/uL   Lymphocytes Relative 6 %   Lymphs Abs 0.6 (L) 0.7 - 4.0 K/uL   Monocytes Relative 4 %   Monocytes Absolute 0.4 0.1 - 1.0 K/uL   Eosinophils Relative 1 %   Eosinophils Absolute 0.1 0.0 - 0.5 K/uL   Basophils Relative 0 %   Basophils Absolute 0.0 0.0 - 0.1 K/uL   Immature Granulocytes 0 %   Abs Immature Granulocytes 0.04 0.00 - 0.07 K/uL  Basic metabolic panel  Result Value Ref Range   Sodium 139 135 - 145 mmol/L   Potassium 3.8 3.5 - 5.1 mmol/L   Chloride 100 98 - 111 mmol/L   CO2 29 22 - 32 mmol/L   Glucose,  Bld 89 70 - 99 mg/dL   BUN 28 (H) 8 - 23 mg/dL   Creatinine, Ser 1.09 (H) 0.44 - 1.00 mg/dL   Calcium 10.0 8.9 - 10.3 mg/dL   GFR calc non Af Amer 43 (L) >60 mL/min   GFR calc Af Amer 49 (  L) >60 mL/min   Anion gap 10 5 - 15  I-Stat CG4 Lactic Acid, ED  Result Value Ref Range   Lactic Acid, Venous 1.22 0.5 - 1.9 mmol/L   Dg Chest 1 View  Result Date: 03/30/2018 CLINICAL DATA:  Altered mental status. EXAM: CHEST  1 VIEW COMPARISON:  Chest x-ray dated June 11, 2017. FINDINGS: Stable cardiomegaly. Atherosclerotic calcification of the aortic arch. The lungs remain hyperinflated with chronic bronchitic changes. New patchy airspace disease in the right greater than left upper lobes. No pleural effusion or pneumothorax. No acute osseous abnormality. IMPRESSION: 1. New patchy airspace disease in the bilateral upper lobes could reflect pneumonia or edema. Electronically Signed   By: Titus Dubin M.D.   On: 03/30/2018 13:23   Dg Shoulder Right  Result Date: 03/28/2018 CLINICAL DATA:  Pain after a fall. EXAM: RIGHT SHOULDER - 2+ VIEW COMPARISON:  None. FINDINGS: Diffuse bone demineralization. Degenerative changes in the right shoulder involving the glenohumeral joint. No evidence of acute fracture or subluxation. No focal bone lesion or bone destruction. Bone cortex and trabecular architecture appear intact. No radiopaque soft tissue foreign bodies. IMPRESSION: Degenerative changes in the right shoulder. No acute bony abnormalities. Electronically Signed   By: Lucienne Capers M.D.   On: 03/28/2018 07:03   Ct Head Wo Contrast  Result Date: 03/28/2018 CLINICAL DATA:  Unwitnessed fall at the 9 time. Pain all over. Struck back of head. EXAM: CT HEAD WITHOUT CONTRAST CT CERVICAL SPINE WITHOUT CONTRAST TECHNIQUE: Multidetector CT imaging of the head and cervical spine was performed following the standard protocol without intravenous contrast. Multiplanar CT image reconstructions of the cervical spine  were also generated. COMPARISON:  06/11/2017 FINDINGS: CT HEAD FINDINGS Brain: Diffuse cerebral atrophy. Ventricular dilatation consistent with central atrophy. Low-attenuation changes in the deep white matter consistent with small vessel ischemic changes and old lacunar infarcts. No mass effect or midline shift. No abnormal extra-axial fluid collections. Gray-white matter junctions are distinct. Basal cisterns are not effaced. No acute intracranial hemorrhage. Vascular: Moderate intracranial vascular calcifications. Skull: Calvarium appears intact. No depressed skull fractures. Sinuses/Orbits: There is complete opacification of the left maxillary antrum with increased density centrally. This could represent chronic sinusitis or perhaps fungal sinusitis. No acute air-fluid levels. Mastoid air cells are clear. Other: None. CT CERVICAL SPINE FINDINGS Alignment: Normal alignment of the cervical vertebrae and facet joints. C1-2 articulation appears intact. Skull base and vertebrae: Skull base appears intact. No vertebral compression deformities. No focal bone lesions or bone destruction. Soft tissues and spinal canal: No prevertebral soft tissue swelling. No abnormal paraspinal soft tissue mass or infiltration. Disc levels: Degenerative changes throughout the cervical spine with narrowed interspaces and prominent endplate hypertrophic changes throughout. Degenerative changes throughout the facet joints and at C1-2. Upper chest: Lung apices are clear. Vascular calcifications. Other: None. IMPRESSION: 1. No acute intracranial abnormalities. Chronic atrophy and small vessel ischemic changes. 2. Normal alignment of the cervical spine. Diffuse degenerative changes. No acute displaced fractures identified. Electronically Signed   By: Lucienne Capers M.D.   On: 03/28/2018 06:53   Ct Cervical Spine Wo Contrast  Result Date: 03/28/2018 CLINICAL DATA:  Unwitnessed fall at the 9 time. Pain all over. Struck back of head. EXAM:  CT HEAD WITHOUT CONTRAST CT CERVICAL SPINE WITHOUT CONTRAST TECHNIQUE: Multidetector CT imaging of the head and cervical spine was performed following the standard protocol without intravenous contrast. Multiplanar CT image reconstructions of the cervical spine were also generated. COMPARISON:  06/11/2017 FINDINGS: CT HEAD  FINDINGS Brain: Diffuse cerebral atrophy. Ventricular dilatation consistent with central atrophy. Low-attenuation changes in the deep white matter consistent with small vessel ischemic changes and old lacunar infarcts. No mass effect or midline shift. No abnormal extra-axial fluid collections. Gray-white matter junctions are distinct. Basal cisterns are not effaced. No acute intracranial hemorrhage. Vascular: Moderate intracranial vascular calcifications. Skull: Calvarium appears intact. No depressed skull fractures. Sinuses/Orbits: There is complete opacification of the left maxillary antrum with increased density centrally. This could represent chronic sinusitis or perhaps fungal sinusitis. No acute air-fluid levels. Mastoid air cells are clear. Other: None. CT CERVICAL SPINE FINDINGS Alignment: Normal alignment of the cervical vertebrae and facet joints. C1-2 articulation appears intact. Skull base and vertebrae: Skull base appears intact. No vertebral compression deformities. No focal bone lesions or bone destruction. Soft tissues and spinal canal: No prevertebral soft tissue swelling. No abnormal paraspinal soft tissue mass or infiltration. Disc levels: Degenerative changes throughout the cervical spine with narrowed interspaces and prominent endplate hypertrophic changes throughout. Degenerative changes throughout the facet joints and at C1-2. Upper chest: Lung apices are clear. Vascular calcifications. Other: None. IMPRESSION: 1. No acute intracranial abnormalities. Chronic atrophy and small vessel ischemic changes. 2. Normal alignment of the cervical spine. Diffuse degenerative changes. No  acute displaced fractures identified. Electronically Signed   By: Lucienne Capers M.D.   On: 03/28/2018 06:53   Ct Abdomen Pelvis W Contrast  Result Date: 03/30/2018 CLINICAL DATA:  Abdominal pain EXAM: CT ABDOMEN AND PELVIS WITH CONTRAST TECHNIQUE: Multidetector CT imaging of the abdomen and pelvis was performed using the standard protocol following bolus administration of intravenous contrast. CONTRAST:  88mL ISOVUE-300 IOPAMIDOL (ISOVUE-300) INJECTION 61% COMPARISON:  None. FINDINGS: Lower chest: Mild dependent atelectatic changes are noted. Hepatobiliary: No focal liver abnormality is seen. No gallstones, gallbladder wall thickening, or biliary dilatation. Pancreas: Unremarkable. No pancreatic ductal dilatation or surrounding inflammatory changes. Spleen: Normal in size without focal abnormality. Adrenals/Urinary Tract: Adrenal glands are within normal limits. Kidneys are well visualized with a normal enhancement pattern. Scattered small cysts are seen. No renal calculi or obstructive changes are seen. The bladder is partially distended. Stomach/Bowel: Stomach is within normal limits. Appendix appears normal. No evidence of bowel wall thickening, distention, or inflammatory changes. Vascular/Lymphatic: Aortic atherosclerosis. No enlarged abdominal or pelvic lymph nodes. Reproductive: Uterus and bilateral adnexa are unremarkable. Other: No abdominal wall hernia or abnormality. No abdominopelvic ascites. Musculoskeletal: Bilateral inferior and right superior pubic ramus fractures are noted. Associated right sacral fracture is seen superiorly. Degenerative changes of the lumbar spine are noted. IMPRESSION: Multiple pelvic fractures consistent with the given clinical history. No acute abdominal abnormality is identified. Electronically Signed   By: Inez Catalina M.D.   On: 03/30/2018 14:33   Dg Humerus Right  Result Date: 03/28/2018 CLINICAL DATA:  Pain after a fall. EXAM: RIGHT HUMERUS - 2+ VIEW  COMPARISON:  None. FINDINGS: Diffuse bone demineralization. Right humerus appears intact. No evidence of acute fracture or subluxation. No focal bone lesion or bone destruction. Bone cortex and trabecular architecture appear intact. Soft tissues are unremarkable. No radiopaque soft tissue foreign bodies. IMPRESSION: Diffuse bone demineralization. No acute bony abnormalities. Electronically Signed   By: Lucienne Capers M.D.   On: 03/28/2018 07:03   Dg Hip Unilat W Or Wo Pelvis 2-3 Views Right  Result Date: 03/28/2018 CLINICAL DATA:  Patient fell at home today. Right shoulder and arm pain, lower back pain, and bilateral hip pain. EXAM: DG HIP (WITH OR WITHOUT PELVIS) 2-3V RIGHT COMPARISON:  06/11/2017 FINDINGS: Diffuse bone demineralization. Visualization of the sacrum and upper pelvis is limited due to bowel gas and stool. Degenerative changes in both hips. Fractures of the right superior and inferior pubic ramus with minimal displacement. No evidence of acute fracture or dislocation of the right hip. Vascular calcifications. IMPRESSION: Acute posttraumatic fractures of the right superior and inferior pubic rami. Diffuse bone demineralization. Electronically Signed   By: Lucienne Capers M.D.   On: 03/28/2018 07:02    ED Treatments / Results  Labs (all labs ordered are listed, but only abnormal results are displayed) Labs Reviewed  CULTURE, BLOOD (ROUTINE X 2)  CULTURE, BLOOD (ROUTINE X 2)  CBC WITH DIFFERENTIAL/PLATELET  BASIC METABOLIC PANEL  I-STAT CG4 LACTIC ACID, ED    EKG None  Radiology No results found.  Procedures Procedures (including critical care time)  Medications Ordered in ED Medications  cefTRIAXone (ROCEPHIN) 1 g in sodium chloride 0.9 % 100 mL IVPB (has no administration in time range)  sodium chloride 0.9 % bolus 1,000 mL (has no administration in time range)  thiamine (B-1) injection 100 mg (has no administration in time range)     Initial Impression /  Assessment and Plan / ED Course  I have reviewed the triage vital signs and the nursing notes.  Pertinent labs & imaging results that were available during my care of the patient were reviewed by me and considered in my medical decision making (see chart for details).     Chest x-ray reviewed by me.  Chest x-ray findings more likely infectious than pulmonary edema, given low-grade fever.  4 PM patient sleeping comfortably.  IV Rocephin and IV Zithromax ordered by me. I have discussed case with Mr. Oscar La, Hershal Coria and with patient's daughter.  Plan she will be returned to [facility] skilled nursing facility.  Supplemental oxygen.  She should receive Rocephin 1 g IV every 24 hours for 7 days.  Zithromax 500 mg daily for 3 days.  Oxygen to titrate pulse oximetry 90 to 95%.  I suggest palliative care consult.  Lab work from yesterday shows urinary tract infection.  Blood cultures and urine culture pending Final Clinical Impressions(s) / ED Diagnoses  Diagnoses #1 community-acquired pneumonia #2 hypoxia #3 urinary tract infection  Final diagnoses:  None    ED Discharge Orders    None       Orlie Dakin, MD 03/30/18 1616

## 2018-03-30 NOTE — Telephone Encounter (Signed)
RX Fax for [facility]@ [phone]  

## 2018-03-30 NOTE — ED Notes (Signed)
Have discontinued O2 by MD's request

## 2018-03-30 NOTE — Discharge Instructions (Signed)
Ms. Wiederhold should receive Rocephin 1 g IV every 24 hours for 7 days and Zithromax 500 mg IV every 24 hours for 3 days.  Day #1 of antibiotics was administered in the emergency department today. Oxygen should be titrated to pulse oximetry 90 to 95%

## 2018-03-30 NOTE — Progress Notes (Signed)
Location:    [facility] Room Number: 140/W Place of Service:  SNF [phone]) Provider:  Lilia Pro, MD  Patient Care Team: [name], MD as PCP - [facility])  Extended Emergency Contact Information Primary Emergency Contact: Fargis,Brenda Address: [address], [place] [address] [place] Phone: [phone] Relation: Daughter  Code Status:  DNR Goals of care: Advanced Directive information Advanced Directives 03/30/2018  Does Patient Have a Medical Advance Directive? Yes  Type of Advance Directive Out of facility DNR (pink MOST or yellow form)  Does patient want to make changes to medical advance directive? No - Patient declined  Would patient like information on creating a medical advance directive? -     Chief Complaint  Patient presents with  . Acute Visit    Patient is Lethargic  With minimal responsiveness  HPI:  Pt is a 84 y.o. female seen today for an acute visit for change in mental status with significant lethargy and minimal responsive state.  Patient was admitted earlier this week for rehab after going to the ER after a fall.  She was found to have a closed fracture of the right pubis both superior and inferior rami- she was sent to skilled nursing here for therapy.  She has been somewhat confused apparently somewhat increased  yesterday and was not emptying her bladder.  She was also trying to get out of bed and was at a high risk for falls.  Dr. Lyndel Safe did reduce her dose of Xanax which she gets PRN at lunch and supper-and also discontinued her oxycodone.     This morning she was found to be minimally responsive- vital signs were reassuring and stable.  But apparently she had minimal responsiveness which was an acute change.  Nursing staff and her daughter was at bedside and monitored her- when I arrived she  eventually appeared to be responding a bit better and  eventually did open her eyes and actually at one point attempted to follow verbal commands-but she was significantly weak and lethargic.  Per her daughter her p.o. intake has been a challenge here over the last day or 2- apparently she had trouble emptying her bladder as well and Dr. Lyndel Safe did order a urinalysis which did come back showing large leukocytes negative nitrites many bacteria  On physical exam patient did attempt to speak but could not really verbalize- she attempted to make eye contact at times but then would close her eyes- continue to be quite lethargic.  Blood pressure taken manually was 140/70-pulse was in the 80s respirations 16 O2 saturation was 92% on room air her blood sugar was 95 she was afebrile  Her other medical diagnoses include hypertension she is on Norvasc- she also has a history of a question superficial DVT in December 2018 had been on Eliquis- which has since been discontinued   .      Past Medical History:  Diagnosis Date  . Anxiety   . Headache   . Hypertension    Past Surgical History:  Procedure Laterality Date  . EYE SURGERY    . SKIN BIOPSY    . skin cancer removal      Allergies  Allergen Reactions  . Aspirin Nausea Only  . Neosporin [Neomycin-Bacitracin Zn-Polymyx] Rash    Outpatient Encounter Medications as of 03/30/2018  Medication Sig  . acetaminophen (TYLENOL) 500 MG tablet Take 1,000  mg by mouth every 8 (eight) hours as needed.   . ALPRAZolam (XANAX) 0.25 MG tablet Take 0.25 mg by mouth 2 (two) times daily as needed for anxiety.  Marland Kitchen amLODipine (NORVASC) 5 MG tablet Take 1 tablet (5 mg total) by mouth daily.  Marland Kitchen apixaban (ELIQUIS) 5 MG TABS tablet Take 10mg  po bid for 7 days then 5mg  po bid  . carbamide peroxide (DEBROX) 6.5 % OTIC solution Place 5 drops into both ears daily as needed.  . Carboxymethylcellul-Glycerin (CLEAR EYES FOR DRY EYES) 1-0.25 % SOLN Place 1-2 drops into both eyes daily as needed.  . polyethylene glycol  (MIRALAX / GLYCOLAX) packet Take 17 g by mouth daily as needed.  . senna-docusate (SENOKOT-S) 8.6-50 MG tablet Take 1 tablet by mouth 2 (two) times daily.  . shark liver oil-cocoa butter (PREPARATION H) 0.25-88.44 % suppository Place 1 suppository rectally as needed for hemorrhoids.  . [DISCONTINUED] ALPRAZolam (XANAX) 0.25 MG tablet Take 1 tablet (0.25 mg total) by mouth 3 (three) times daily as needed for anxiety. (Patient taking differently: Take 0.25 mg by mouth 2 (two) times daily as needed for anxiety. )  . [DISCONTINUED] naproxen sodium (ANAPROX) 220 MG tablet Take 220 mg by mouth 2 (two) times daily with a meal.  . [DISCONTINUED] oxyCODONE-acetaminophen (PERCOCET/ROXICET) 5-325 MG tablet Take 1 tablet by mouth every 6 (six) hours as needed for severe pain.   No facility-administered encounter medications on file as of 03/30/2018.     Review of Systems   Could not really be assessed secondary to patient's lethargy and not speaking please see HPI  There is no immunization history on file for this patient. Pertinent  Health Maintenance Due  Topic Date Due  . INFLUENZA VACCINE  04/30/2018 (Originally 01/18/2018)  . DEXA SCAN  04/30/2018 (Originally 09/01/1990)  . PNA vac Low Risk Adult (1 of 2 - PCV13) 04/30/2018 (Originally 09/01/1988)   No flowsheet data found. Functional Status Survey:    Vitals:   03/30/18 1029  BP: 136/78  Pulse: 82  Resp: 16  Temp: 97.6 F (36.4 C)  TempSrc: Oral  SpO2: 92%   Subsequent manual blood pressure was 140/70-.  Her CBG was 95  Physical Exam   In general this is a somewhat frail elderly female she does not appear to be in any acute distress but appears to be minimally responsive- eventuallyper serial check she did open her eyes and did attempt to speak but remains significantly lethargic.  Her skin is warm and dry she is not diaphoretic.  Eyes sclera and conjunctive are clear pupils the appear to be possibly slightly sluggish-this was  difficult to fully tell since any attempt to examine her eyes she closed her eyelids tightly  Oropharynx she does have a slight film on her tongue she did attempt to protrude her tongue but was not really fully able secondary to lethargy.  Chest is clear to auscultation with shallow air entry somewhat poor respiratory effort she did attempt to take deep breaths at one point.  Heart is regular rate and rhythm without murmur gallop or rub she does not have significant lower extremity edema.  Abdomen is soft did grimace when it was palpated this was hard to localize bowel sounds are positive.  Suprapubic appeared to have some tenderness in this area with possible distention.  Musculoskeletal he did appear to have muscle tone of her upper extremities when her arms are raised there was some resistance when she lowered her arms- she did not  really follow verbal commands about moving her legs-touch sensation did appear to be intact in all extremities  Neurologic as noted above initially minimally responsive but eventually did at times open her eyes but then closed her eyelids tightly- she attempted to speak but could not really not really speak  This seemed to be more secondary to the lethargy.  Psych-  Could not really be evaluated secondary to lethargy  Labs reviewed: Recent Labs    06/11/17 1050 06/12/17 0501 03/28/18 0631  NA 142 143 141  K 3.7 4.0 3.8  CL 103 108 102  CO2 29 25 31   GLUCOSE 86 70 99  BUN 17 19 15   CREATININE 0.88 0.93 0.88  CALCIUM 9.9 9.1 10.0   Recent Labs    06/11/17 1050 03/28/18 0631  AST 24 24  ALT 14 19  ALKPHOS 69 72  BILITOT 0.6 0.7  PROT 7.0 7.5  ALBUMIN 3.7 4.0   Recent Labs    06/11/17 1050 06/12/17 0501 03/28/18 0631  WBC 8.2 7.4 10.2  NEUTROABS 6.7  --  8.6*  HGB 11.9* 10.8* 12.4  HCT 39.3 34.9* 40.6  MCV 99.7 100.0 100.0  PLT 264 258 226   Lab Results  Component Value Date   TSH 2.413 07/25/2016   No results found for:  HGBA1C No results found for: CHOL, HDL, LDLCALC, LDLDIRECT, TRIG, CHOLHDL  Significant Diagnostic Results in last 30 days:  Dg Shoulder Right  Result Date: 03/28/2018 CLINICAL DATA:  Pain after a fall. EXAM: RIGHT SHOULDER - 2+ VIEW COMPARISON:  None. FINDINGS: Diffuse bone demineralization. Degenerative changes in the right shoulder involving the glenohumeral joint. No evidence of acute fracture or subluxation. No focal bone lesion or bone destruction. Bone cortex and trabecular architecture appear intact. No radiopaque soft tissue foreign bodies. IMPRESSION: Degenerative changes in the right shoulder. No acute bony abnormalities. Electronically Signed   By: Lucienne Capers M.D.   On: 03/28/2018 07:03   Ct Head Wo Contrast  Result Date: 03/28/2018 CLINICAL DATA:  Unwitnessed fall at the 9 time. Pain all over. Struck back of head. EXAM: CT HEAD WITHOUT CONTRAST CT CERVICAL SPINE WITHOUT CONTRAST TECHNIQUE: Multidetector CT imaging of the head and cervical spine was performed following the standard protocol without intravenous contrast. Multiplanar CT image reconstructions of the cervical spine were also generated. COMPARISON:  06/11/2017 FINDINGS: CT HEAD FINDINGS Brain: Diffuse cerebral atrophy. Ventricular dilatation consistent with central atrophy. Low-attenuation changes in the deep white matter consistent with small vessel ischemic changes and old lacunar infarcts. No mass effect or midline shift. No abnormal extra-axial fluid collections. Gray-white matter junctions are distinct. Basal cisterns are not effaced. No acute intracranial hemorrhage. Vascular: Moderate intracranial vascular calcifications. Skull: Calvarium appears intact. No depressed skull fractures. Sinuses/Orbits: There is complete opacification of the left maxillary antrum with increased density centrally. This could represent chronic sinusitis or perhaps fungal sinusitis. No acute air-fluid levels. Mastoid air cells are clear. Other:  None. CT CERVICAL SPINE FINDINGS Alignment: Normal alignment of the cervical vertebrae and facet joints. C1-2 articulation appears intact. Skull base and vertebrae: Skull base appears intact. No vertebral compression deformities. No focal bone lesions or bone destruction. Soft tissues and spinal canal: No prevertebral soft tissue swelling. No abnormal paraspinal soft tissue mass or infiltration. Disc levels: Degenerative changes throughout the cervical spine with narrowed interspaces and prominent endplate hypertrophic changes throughout. Degenerative changes throughout the facet joints and at C1-2. Upper chest: Lung apices are clear. Vascular calcifications. Other: None. IMPRESSION: 1.  No acute intracranial abnormalities. Chronic atrophy and small vessel ischemic changes. 2. Normal alignment of the cervical spine. Diffuse degenerative changes. No acute displaced fractures identified. Electronically Signed   By: Lucienne Capers M.D.   On: 03/28/2018 06:53   Ct Cervical Spine Wo Contrast  Result Date: 03/28/2018 CLINICAL DATA:  Unwitnessed fall at the 9 time. Pain all over. Struck back of head. EXAM: CT HEAD WITHOUT CONTRAST CT CERVICAL SPINE WITHOUT CONTRAST TECHNIQUE: Multidetector CT imaging of the head and cervical spine was performed following the standard protocol without intravenous contrast. Multiplanar CT image reconstructions of the cervical spine were also generated. COMPARISON:  06/11/2017 FINDINGS: CT HEAD FINDINGS Brain: Diffuse cerebral atrophy. Ventricular dilatation consistent with central atrophy. Low-attenuation changes in the deep white matter consistent with small vessel ischemic changes and old lacunar infarcts. No mass effect or midline shift. No abnormal extra-axial fluid collections. Gray-white matter junctions are distinct. Basal cisterns are not effaced. No acute intracranial hemorrhage. Vascular: Moderate intracranial vascular calcifications. Skull: Calvarium appears intact. No  depressed skull fractures. Sinuses/Orbits: There is complete opacification of the left maxillary antrum with increased density centrally. This could represent chronic sinusitis or perhaps fungal sinusitis. No acute air-fluid levels. Mastoid air cells are clear. Other: None. CT CERVICAL SPINE FINDINGS Alignment: Normal alignment of the cervical vertebrae and facet joints. C1-2 articulation appears intact. Skull base and vertebrae: Skull base appears intact. No vertebral compression deformities. No focal bone lesions or bone destruction. Soft tissues and spinal canal: No prevertebral soft tissue swelling. No abnormal paraspinal soft tissue mass or infiltration. Disc levels: Degenerative changes throughout the cervical spine with narrowed interspaces and prominent endplate hypertrophic changes throughout. Degenerative changes throughout the facet joints and at C1-2. Upper chest: Lung apices are clear. Vascular calcifications. Other: None. IMPRESSION: 1. No acute intracranial abnormalities. Chronic atrophy and small vessel ischemic changes. 2. Normal alignment of the cervical spine. Diffuse degenerative changes. No acute displaced fractures identified. Electronically Signed   By: Lucienne Capers M.D.   On: 03/28/2018 06:53   Dg Humerus Right  Result Date: 03/28/2018 CLINICAL DATA:  Pain after a fall. EXAM: RIGHT HUMERUS - 2+ VIEW COMPARISON:  None. FINDINGS: Diffuse bone demineralization. Right humerus appears intact. No evidence of acute fracture or subluxation. No focal bone lesion or bone destruction. Bone cortex and trabecular architecture appear intact. Soft tissues are unremarkable. No radiopaque soft tissue foreign bodies. IMPRESSION: Diffuse bone demineralization. No acute bony abnormalities. Electronically Signed   By: Lucienne Capers M.D.   On: 03/28/2018 07:03   Dg Hip Unilat W Or Wo Pelvis 2-3 Views Right  Result Date: 03/28/2018 CLINICAL DATA:  Patient fell at home today. Right shoulder and arm  pain, lower back pain, and bilateral hip pain. EXAM: DG HIP (WITH OR WITHOUT PELVIS) 2-3V RIGHT COMPARISON:  06/11/2017 FINDINGS: Diffuse bone demineralization. Visualization of the sacrum and upper pelvis is limited due to bowel gas and stool. Degenerative changes in both hips. Fractures of the right superior and inferior pubic ramus with minimal displacement. No evidence of acute fracture or dislocation of the right hip. Vascular calcifications. IMPRESSION: Acute posttraumatic fractures of the right superior and inferior pubic rami. Diffuse bone demineralization. Electronically Signed   By: Lucienne Capers M.D.   On: 03/28/2018 07:02    Assessment/Plan  #1 minimal responsiveness with lethargy- altered mental status-- per serial exam she appeared to improve somewhat in this regard-I did discuss this fairly extensively with her daughter about options- possibly obtaining further lab work in  facility versus sending her to the ER for most likely more expedient labs and possibly further imaging.  Per discussion will send her to the ER for evaluation- again vital signs are reassuring- and she is somewhat more responsive but still has had a significant change from her previous baseline.  [id number]

## 2018-04-01 LAB — URINE CULTURE

## 2018-04-02 ENCOUNTER — Encounter: Payer: Self-pay | Admitting: Internal Medicine

## 2018-04-02 ENCOUNTER — Encounter: Payer: Self-pay | Admitting: Orthopedic Surgery

## 2018-04-02 ENCOUNTER — Ambulatory Visit: Payer: Self-pay | Admitting: Orthopedic Surgery

## 2018-04-02 ENCOUNTER — Encounter (HOSPITAL_COMMUNITY)
Admission: RE | Admit: 2018-04-02 | Discharge: 2018-04-02 | Disposition: A | Discharge status: Home / Self Care | Payer: Medicare Other | Source: Skilled Nursing Facility | Attending: Internal Medicine | Admitting: Internal Medicine

## 2018-04-02 ENCOUNTER — Telehealth: Payer: Self-pay | Admitting: Orthopedic Surgery

## 2018-04-02 ENCOUNTER — Non-Acute Institutional Stay (SKILLED_NURSING_FACILITY): Payer: Medicare Other | Admitting: Internal Medicine

## 2018-04-02 DIAGNOSIS — D649 Anemia, unspecified: Secondary | ICD-10-CM | POA: Diagnosis not present

## 2018-04-02 DIAGNOSIS — S32591D Other specified fracture of right pubis, subsequent encounter for fracture with routine healing: Secondary | ICD-10-CM | POA: Diagnosis not present

## 2018-04-02 DIAGNOSIS — R531 Weakness: Secondary | ICD-10-CM

## 2018-04-02 DIAGNOSIS — M6281 Muscle weakness (generalized): Secondary | ICD-10-CM | POA: Diagnosis not present

## 2018-04-02 DIAGNOSIS — E860 Dehydration: Secondary | ICD-10-CM | POA: Diagnosis not present

## 2018-04-02 DIAGNOSIS — I10 Essential (primary) hypertension: Secondary | ICD-10-CM | POA: Insufficient documentation

## 2018-04-02 DIAGNOSIS — R296 Repeated falls: Secondary | ICD-10-CM | POA: Diagnosis not present

## 2018-04-02 DIAGNOSIS — Z9181 History of falling: Secondary | ICD-10-CM | POA: Diagnosis not present

## 2018-04-02 DIAGNOSIS — R279 Unspecified lack of coordination: Secondary | ICD-10-CM | POA: Diagnosis not present

## 2018-04-02 LAB — CBC
HCT: 32.5 % — ABNORMAL LOW (ref 36.0–46.0)
Hemoglobin: 9.7 g/dL — ABNORMAL LOW (ref 12.0–15.0)
MCH: 29.6 pg (ref 26.0–34.0)
MCHC: 29.8 g/dL — ABNORMAL LOW (ref 30.0–36.0)
MCV: 99.1 fL (ref 80.0–100.0)
NRBC: 0 % (ref 0.0–0.2)
PLATELETS: 213 10*3/uL (ref 150–400)
RBC: 3.28 MIL/uL — ABNORMAL LOW (ref 3.87–5.11)
RDW: 14 % (ref 11.5–15.5)
WBC: 8.5 10*3/uL (ref 4.0–10.5)

## 2018-04-02 LAB — BASIC METABOLIC PANEL
ANION GAP: 5 (ref 5–15)
BUN: 23 mg/dL (ref 8–23)
CALCIUM: 9.2 mg/dL (ref 8.9–10.3)
CHLORIDE: 108 mmol/L (ref 98–111)
CO2: 29 mmol/L (ref 22–32)
CREATININE: 0.73 mg/dL (ref 0.44–1.00)
GFR calc non Af Amer: >60 mL/min (ref >60)
Glucose, Bld: 82 mg/dL (ref 70–99)
Potassium: 3.8 mmol/L (ref 3.5–5.1)
SODIUM: 142 mmol/L (ref 135–145)

## 2018-04-02 NOTE — Progress Notes (Signed)
Location:    [facility] Room Number: 140/D Place of Service:  SNF [id number]) Provider:  Veleta Miners MD  [name], MD  Patient Care Team: [name], MD as PCP - General [facility] Medicine)  Extended Emergency Contact Information Primary Emergency Contact: Fargis,Brenda Address: [address], [place] [address] [place] Phone: [phone] Relation: Daughter  Code Status:  DNR Goals of care: Advanced Directive information Advanced Directives 04/02/2018  Does Patient Have a Medical Advance Directive? Yes  Type of Advance Directive Out of facility DNR (pink MOST or yellow form)  Does patient want to make changes to medical advance directive? No - Patient declined  Would patient like information on creating a medical advance directive? -  Pre-existing out of facility DNR order (yellow form or pink MOST form) -     Chief Complaint  Patient presents with  . Acute Visit    F/U from ED visit     HPI:  Pt is a 84 y.o. female seen today for an acute visit for follow up of Pneumonia and UTI diagnosed in ED. She has h/o Hypertension and Superficial DVT Diagnosed in 12/18 Patient was brought to ED after fall at home. She fell while getting in the Bed. IT seems she was on the floor for few hours before her Yolanda Bonine found her. She was found to have Closed Fracture of Right Pubis both Superior and Inferior Rami. She was unable to put any weight on her legs. She was send to SNF for Therapy . In Facility patient was very confused and got Lethargic and was send back to ED where she was diagnosed with Pneumonia and UTI. She was started on IV rocephin and Zithromax. Patient is doing better now. Per daughter she is more awake and responding. Her daughter wanted to know if she can be switched to PO antibiotics. Also she did not want her to go to Ortho.  Patient c/O Pain . But otherwise no acute complains. Her daughter wants her to  stay in SNF and get Therapy.   Past Medical History:  Diagnosis Date  . Anxiety   . Headache   . Hypertension    Past Surgical History:  Procedure Laterality Date  . EYE SURGERY    . SKIN BIOPSY    . skin cancer removal      Allergies  Allergen Reactions  . Aspirin Nausea Only  . Neosporin [Neomycin-Bacitracin Zn-Polymyx] Rash    Outpatient Encounter Medications as of 04/02/2018  Medication Sig  . acetaminophen (TYLENOL) 500 MG tablet Take 1,000 mg by mouth every 8 (eight) hours as needed.   . ALPRAZolam (XANAX) 0.25 MG tablet Take 0.25 mg by mouth 2 (two) times daily as needed for anxiety.   Marland Kitchen amLODipine (NORVASC) 5 MG tablet Take 1 tablet (5 mg total) by mouth daily.  Marland Kitchen apixaban (ELIQUIS) 5 MG TABS tablet Take 10mg  po bid for 7 days then 5mg  po bid  . carbamide peroxide (DEBROX) 6.5 % OTIC solution Place 5 drops into both ears daily as needed.  . Carboxymethylcellul-Glycerin (CLEAR EYES FOR DRY EYES) 1-0.25 % SOLN Place 1-2 drops into both eyes daily as needed.  . cefTRIAXone Sodium (ROCEPHIN IV) Give 1 gram IV q 24 hours 7 days for UTI and pneumonia from 03/30/2018-04/05/2018  . polyethylene glycol (MIRALAX / GLYCOLAX) packet Take 17 g by mouth daily as needed.  . senna-docusate (SENOKOT-S) 8.6-50 MG tablet  Take 1 tablet by mouth 2 (two) times daily.  . shark liver oil-cocoa butter (PREPARATION H) 0.25-88.44 % suppository Place 1 suppository rectally as needed for hemorrhoids.  . sodium chloride 0.9 % injection Inject 3 mLs into the vein 2 (two) times daily. Before and after IV BO administration  . sodium chloride 0.9 % injection Inject 3 mLs into the vein 2 (two) times daily. Qshift on shifts not receiving IV ABO  . [DISCONTINUED] oxyCODONE-acetaminophen (PERCOCET/ROXICET) 5-325 MG tablet Take 1 tablet by mouth every 6 (six) hours as needed for severe pain.   No facility-administered encounter medications on file as of 04/02/2018.      Review of Systems  Review of  Systems  Constitutional: Negative for activity change, appetite change, chills, diaphoresis, fatigue and fever.  HENT: Negative for mouth sores, postnasal drip, rhinorrhea, sinus pain and sore throat.   Respiratory: Negative for apnea, cough, chest tightness, shortness of breath and wheezing.   Cardiovascular: Negative for chest pain, palpitations and leg swelling.  Gastrointestinal: Negative for abdominal distention, abdominal pain, constipation, diarrhea, nausea and vomiting.  Genitourinary: Negative for dysuria and frequency.  Musculoskeletal: Negative for arthralgias, joint swelling and myalgias.  Skin: Negative for rash.  Neurological: Negative for dizziness, syncope, weakness, light-headedness and numbness.  Psychiatric/Behavioral: Negative for behavioral problems, confusion and sleep disturbance.      There is no immunization history on file for this patient. Pertinent  Health Maintenance Due  Topic Date Due  . INFLUENZA VACCINE  04/30/2018 (Originally 01/18/2018)  . DEXA SCAN  04/30/2018 (Originally 09/01/1990)  . PNA vac Low Risk Adult (1 of 2 - PCV13) 04/30/2018 (Originally 09[birth year]01/1990)   No flowsheet data found. Functional Status Survey:    There were no vitals filed for this visit. There is no height or weight on file to calculate BMI. Physical Exam  Constitutional: She appears well-developed.  Very Frail  HENT:  Head: Normocephalic.  Mouth/Throat: Oropharynx is clear and moist.  Eyes: Pupils are equal, round, and reactive to light.  Neck: Neck supple.  Cardiovascular: Normal rate and regular rhythm.  No murmur heard. Pulmonary/Chest: Effort normal and breath sounds normal. No stridor. No respiratory distress. She has no wheezes.  Abdominal: Soft. Bowel sounds are normal. She exhibits no distension. There is no tenderness. There is no guarding.  Musculoskeletal: She exhibits no edema.  Neurological: She is alert.  Not oriented. Is following Commands today and is more  alert. No hallucinations  Skin: Skin is warm and dry.  Psychiatric: She has a normal mood and affect. Her behavior is normal. Thought content normal.    Labs reviewed: Recent Labs    03/28/18 0631 03/30/18 1301 04/02/18 0300  NA 141 139 142  K 3.8 3.8 3.8  CL 102 100 108  CO2 31 29 29   GLUCOSE 99 89 82  BUN 15 28* 23  CREATININE 0.88 1.09* 0.73  CALCIUM 10.0 10.0 9.2   Recent Labs    06/11/17 1050 03/28/18 0631  AST 24 24  ALT 14 19  ALKPHOS 69 72  BILITOT 0.6 0.7  PROT 7.0 7.5  ALBUMIN 3.7 4.0   Recent Labs    06/11/17 1050  03/28/18 0631 03/30/18 1301 04/02/18 0300  WBC 8.2   < > 10.2 9.9 8.5  NEUTROABS 6.7  --  8.6* 8.8*  --   HGB 11.9*   < > 12.4 17.3* 9.7*  HCT 39.3   < > 40.6 55.9* 32.5*  MCV 99.7   < > 100.0 97.2  99.1  PLT 264   < > 226 193 213   < > = values in this interval not displayed.   Lab Results  Component Value Date   TSH 2.413 07/25/2016   No results found for: HGBA1C No results found for: CHOL, HDL, LDLCALC, LDLDIRECT, TRIG, CHOLHDL  Significant Diagnostic Results in last 30 days:  Dg Chest 1 View  Result Date: 03/30/2018 CLINICAL DATA:  Altered mental status. EXAM: CHEST  1 VIEW COMPARISON:  Chest x-ray dated June 11, 2017. FINDINGS: Stable cardiomegaly. Atherosclerotic calcification of the aortic arch. The lungs remain hyperinflated with chronic bronchitic changes. New patchy airspace disease in the right greater than left upper lobes. No pleural effusion or pneumothorax. No acute osseous abnormality. IMPRESSION: 1. New patchy airspace disease in the bilateral upper lobes could reflect pneumonia or edema. Electronically Signed   By: Titus Dubin M.D.   On: 03/30/2018 13:23   Dg Shoulder Right  Result Date: 03/28/2018 CLINICAL DATA:  Pain after a fall. EXAM: RIGHT SHOULDER - 2+ VIEW COMPARISON:  None. FINDINGS: Diffuse bone demineralization. Degenerative changes in the right shoulder involving the glenohumeral joint. No evidence  of acute fracture or subluxation. No focal bone lesion or bone destruction. Bone cortex and trabecular architecture appear intact. No radiopaque soft tissue foreign bodies. IMPRESSION: Degenerative changes in the right shoulder. No acute bony abnormalities. Electronically Signed   By: Lucienne Capers M.D.   On: 03/28/2018 07:03   Ct Head Wo Contrast  Result Date: 03/28/2018 CLINICAL DATA:  Unwitnessed fall at the 9 time. Pain all over. Struck back of head. EXAM: CT HEAD WITHOUT CONTRAST CT CERVICAL SPINE WITHOUT CONTRAST TECHNIQUE: Multidetector CT imaging of the head and cervical spine was performed following the standard protocol without intravenous contrast. Multiplanar CT image reconstructions of the cervical spine were also generated. COMPARISON:  06/11/2017 FINDINGS: CT HEAD FINDINGS Brain: Diffuse cerebral atrophy. Ventricular dilatation consistent with central atrophy. Low-attenuation changes in the deep white matter consistent with small vessel ischemic changes and old lacunar infarcts. No mass effect or midline shift. No abnormal extra-axial fluid collections. Gray-white matter junctions are distinct. Basal cisterns are not effaced. No acute intracranial hemorrhage. Vascular: Moderate intracranial vascular calcifications. Skull: Calvarium appears intact. No depressed skull fractures. Sinuses/Orbits: There is complete opacification of the left maxillary antrum with increased density centrally. This could represent chronic sinusitis or perhaps fungal sinusitis. No acute air-fluid levels. Mastoid air cells are clear. Other: None. CT CERVICAL SPINE FINDINGS Alignment: Normal alignment of the cervical vertebrae and facet joints. C1-2 articulation appears intact. Skull base and vertebrae: Skull base appears intact. No vertebral compression deformities. No focal bone lesions or bone destruction. Soft tissues and spinal canal: No prevertebral soft tissue swelling. No abnormal paraspinal soft tissue mass or  infiltration. Disc levels: Degenerative changes throughout the cervical spine with narrowed interspaces and prominent endplate hypertrophic changes throughout. Degenerative changes throughout the facet joints and at C1-2. Upper chest: Lung apices are clear. Vascular calcifications. Other: None. IMPRESSION: 1. No acute intracranial abnormalities. Chronic atrophy and small vessel ischemic changes. 2. Normal alignment of the cervical spine. Diffuse degenerative changes. No acute displaced fractures identified. Electronically Signed   By: Lucienne Capers M.D.   On: 03/28/2018 06:53   Ct Cervical Spine Wo Contrast  Result Date: 03/28/2018 CLINICAL DATA:  Unwitnessed fall at the 9 time. Pain all over. Struck back of head. EXAM: CT HEAD WITHOUT CONTRAST CT CERVICAL SPINE WITHOUT CONTRAST TECHNIQUE: Multidetector CT imaging of the head and cervical  spine was performed following the standard protocol without intravenous contrast. Multiplanar CT image reconstructions of the cervical spine were also generated. COMPARISON:  06/11/2017 FINDINGS: CT HEAD FINDINGS Brain: Diffuse cerebral atrophy. Ventricular dilatation consistent with central atrophy. Low-attenuation changes in the deep white matter consistent with small vessel ischemic changes and old lacunar infarcts. No mass effect or midline shift. No abnormal extra-axial fluid collections. Gray-white matter junctions are distinct. Basal cisterns are not effaced. No acute intracranial hemorrhage. Vascular: Moderate intracranial vascular calcifications. Skull: Calvarium appears intact. No depressed skull fractures. Sinuses/Orbits: There is complete opacification of the left maxillary antrum with increased density centrally. This could represent chronic sinusitis or perhaps fungal sinusitis. No acute air-fluid levels. Mastoid air cells are clear. Other: None. CT CERVICAL SPINE FINDINGS Alignment: Normal alignment of the cervical vertebrae and facet joints. C1-2 articulation  appears intact. Skull base and vertebrae: Skull base appears intact. No vertebral compression deformities. No focal bone lesions or bone destruction. Soft tissues and spinal canal: No prevertebral soft tissue swelling. No abnormal paraspinal soft tissue mass or infiltration. Disc levels: Degenerative changes throughout the cervical spine with narrowed interspaces and prominent endplate hypertrophic changes throughout. Degenerative changes throughout the facet joints and at C1-2. Upper chest: Lung apices are clear. Vascular calcifications. Other: None. IMPRESSION: 1. No acute intracranial abnormalities. Chronic atrophy and small vessel ischemic changes. 2. Normal alignment of the cervical spine. Diffuse degenerative changes. No acute displaced fractures identified. Electronically Signed   By: Lucienne Capers M.D.   On: 03/28/2018 06:53   Ct Abdomen Pelvis W Contrast  Result Date: 03/30/2018 CLINICAL DATA:  Abdominal pain EXAM: CT ABDOMEN AND PELVIS WITH CONTRAST TECHNIQUE: Multidetector CT imaging of the abdomen and pelvis was performed using the standard protocol following bolus administration of intravenous contrast. CONTRAST:  59mL ISOVUE-300 IOPAMIDOL (ISOVUE-300) INJECTION 61% COMPARISON:  None. FINDINGS: Lower chest: Mild dependent atelectatic changes are noted. Hepatobiliary: No focal liver abnormality is seen. No gallstones, gallbladder wall thickening, or biliary dilatation. Pancreas: Unremarkable. No pancreatic ductal dilatation or surrounding inflammatory changes. Spleen: Normal in size without focal abnormality. Adrenals/Urinary Tract: Adrenal glands are within normal limits. Kidneys are well visualized with a normal enhancement pattern. Scattered small cysts are seen. No renal calculi or obstructive changes are seen. The bladder is partially distended. Stomach/Bowel: Stomach is within normal limits. Appendix appears normal. No evidence of bowel wall thickening, distention, or inflammatory changes.  Vascular/Lymphatic: Aortic atherosclerosis. No enlarged abdominal or pelvic lymph nodes. Reproductive: Uterus and bilateral adnexa are unremarkable. Other: No abdominal wall hernia or abnormality. No abdominopelvic ascites. Musculoskeletal: Bilateral inferior and right superior pubic ramus fractures are noted. Associated right sacral fracture is seen superiorly. Degenerative changes of the lumbar spine are noted. IMPRESSION: Multiple pelvic fractures consistent with the given clinical history. No acute abdominal abnormality is identified. Electronically Signed   By: Inez Catalina M.D.   On: 03/30/2018 14:33   Dg Humerus Right  Result Date: 03/28/2018 CLINICAL DATA:  Pain after a fall. EXAM: RIGHT HUMERUS - 2+ VIEW COMPARISON:  None. FINDINGS: Diffuse bone demineralization. Right humerus appears intact. No evidence of acute fracture or subluxation. No focal bone lesion or bone destruction. Bone cortex and trabecular architecture appear intact. Soft tissues are unremarkable. No radiopaque soft tissue foreign bodies. IMPRESSION: Diffuse bone demineralization. No acute bony abnormalities. Electronically Signed   By: Lucienne Capers M.D.   On: 03/28/2018 07:03   Dg Hip Unilat W Or Wo Pelvis 2-3 Views Right  Result Date: 03/28/2018 CLINICAL DATA:  Patient fell at home today. Right shoulder and arm pain, lower back pain, and bilateral hip pain. EXAM: DG HIP (WITH OR WITHOUT PELVIS) 2-3V RIGHT COMPARISON:  06/11/2017 FINDINGS: Diffuse bone demineralization. Visualization of the sacrum and upper pelvis is limited due to bowel gas and stool. Degenerative changes in both hips. Fractures of the right superior and inferior pubic ramus with minimal displacement. No evidence of acute fracture or dislocation of the right hip. Vascular calcifications. IMPRESSION: Acute posttraumatic fractures of the right superior and inferior pubic rami. Diffuse bone demineralization. Electronically Signed   By: Lucienne Capers M.D.   On:  03/28/2018 07:02    Assessment/Plan  Pneumonia She finished course of Zithromax. And on IV Rocephin. Will change to PO antibiotics. Augmentin for 5 more days UTI Augmentin will cover the infection Already received IV rocephin. S/P Pelvic Rami Fracture Tylenol PRN Will D/W Therapy. D/W the Daughter in detail and she does not want Ortho Consult right Now.  Hypertension Will increase the  Norvasc to 7.5 mg as he BP is consistently elevated in the SNF. Per daughter Patient was not taking her Amlodipine at home  ? Superficial DVT in 12/18 Per daughter Patient did take her Eliquis for 3 months.  Was  discontinue it especially since her daughter says she has h/o Recurrent Falls. Anemia Patient has sudden drop in her Hgb Today. Not sure if it is accurate. She does not have any signs of Overt Bleed. Will repeat CBC tomorrow Dementia with ACP. Patient had CT scan of head  which was negative or any acute process. Daughter does understand that patient will need more Supervision at home. I also d/w the daughter about MOST form. Patient is DNR.   Family/ staff Communication:   Labs/tests ordered:   Total time spent in this patient care encounter was 45_ minutes; greater than 50% of the visit spent counseling patient, reviewing records , Labs and coordinating care for problems addressed at this encounter.

## 2018-04-02 NOTE — Telephone Encounter (Signed)
As of this morning, 04/02/18, [place] confirmed patient's appointment for today with Dr Aline Brochure, for fracture of pubic ramos. She mentioned that patient's daughter was considering cancelling, due to patient having urinary tract infection. As of early this afternoon, I called back to [facility] as patient had not arrived to the visit; per Renae, [employer], [phone], states daughter requested to have the appointment cancelled. Renae said that daughter is having patient seen by her doctor for the UTI. As of yet, daughter has not called back to re-schedule.  Any further advice?

## 2018-04-03 ENCOUNTER — Encounter (HOSPITAL_COMMUNITY)
Admission: RE | Admit: 2018-04-03 | Discharge: 2018-04-03 | Disposition: A | Discharge status: Home / Self Care | Payer: Medicare Other | Source: Skilled Nursing Facility | Attending: Internal Medicine | Admitting: Internal Medicine

## 2018-04-03 DIAGNOSIS — R279 Unspecified lack of coordination: Secondary | ICD-10-CM | POA: Diagnosis not present

## 2018-04-03 DIAGNOSIS — I10 Essential (primary) hypertension: Secondary | ICD-10-CM | POA: Insufficient documentation

## 2018-04-03 DIAGNOSIS — F411 Generalized anxiety disorder: Secondary | ICD-10-CM | POA: Diagnosis not present

## 2018-04-03 DIAGNOSIS — I80201 Phlebitis and thrombophlebitis of unspecified deep vessels of right lower extremity: Secondary | ICD-10-CM | POA: Diagnosis not present

## 2018-04-03 DIAGNOSIS — S32591D Other specified fracture of right pubis, subsequent encounter for fracture with routine healing: Secondary | ICD-10-CM | POA: Insufficient documentation

## 2018-04-03 DIAGNOSIS — M6281 Muscle weakness (generalized): Secondary | ICD-10-CM | POA: Diagnosis not present

## 2018-04-03 DIAGNOSIS — E860 Dehydration: Secondary | ICD-10-CM | POA: Diagnosis not present

## 2018-04-03 DIAGNOSIS — Z9181 History of falling: Secondary | ICD-10-CM | POA: Diagnosis not present

## 2018-04-03 DIAGNOSIS — R296 Repeated falls: Secondary | ICD-10-CM | POA: Diagnosis not present

## 2018-04-03 LAB — CBC
HCT: 36.7 % (ref 36.0–46.0)
Hemoglobin: 10.9 g/dL — ABNORMAL LOW (ref 12.0–15.0)
MCH: 29.4 pg (ref 26.0–34.0)
MCHC: 29.7 g/dL — ABNORMAL LOW (ref 30.0–36.0)
MCV: 98.9 fL (ref 80.0–100.0)
NRBC: 0 % (ref 0.0–0.2)
PLATELETS: 252 10*3/uL (ref 150–400)
RBC: 3.71 MIL/uL — AB (ref 3.87–5.11)
RDW: 13.8 % (ref 11.5–15.5)
WBC: 6.7 10*3/uL (ref 4.0–10.5)

## 2018-04-04 ENCOUNTER — Encounter: Payer: Self-pay | Admitting: Internal Medicine

## 2018-04-04 ENCOUNTER — Non-Acute Institutional Stay (SKILLED_NURSING_FACILITY): Payer: Medicare Other | Admitting: Internal Medicine

## 2018-04-04 DIAGNOSIS — E860 Dehydration: Secondary | ICD-10-CM | POA: Diagnosis not present

## 2018-04-04 DIAGNOSIS — D649 Anemia, unspecified: Secondary | ICD-10-CM

## 2018-04-04 DIAGNOSIS — Z9181 History of falling: Secondary | ICD-10-CM | POA: Diagnosis not present

## 2018-04-04 DIAGNOSIS — S32591D Other specified fracture of right pubis, subsequent encounter for fracture with routine healing: Secondary | ICD-10-CM | POA: Diagnosis not present

## 2018-04-04 DIAGNOSIS — M6281 Muscle weakness (generalized): Secondary | ICD-10-CM | POA: Diagnosis not present

## 2018-04-04 DIAGNOSIS — I10 Essential (primary) hypertension: Secondary | ICD-10-CM

## 2018-04-04 DIAGNOSIS — R296 Repeated falls: Secondary | ICD-10-CM | POA: Diagnosis not present

## 2018-04-04 DIAGNOSIS — Z8701 Personal history of pneumonia (recurrent): Secondary | ICD-10-CM

## 2018-04-04 DIAGNOSIS — R279 Unspecified lack of coordination: Secondary | ICD-10-CM | POA: Diagnosis not present

## 2018-04-04 LAB — CULTURE, BLOOD (ROUTINE X 2)
Culture: NO GROWTH
SPECIAL REQUESTS: ADEQUATE

## 2018-04-04 NOTE — Progress Notes (Signed)
Location:    [facility] Room Number: 140/D Place of Service:  SNF (31) Provider:  Lilia Pro, MD  Patient Care Team: [name], MD as PCP - General [facility] Medicine)  Extended Emergency Contact Information Primary Emergency Contact: Fargis,Brenda Address: [address] [place] [address] [place] Phone: [phone] Relation: Daughter  Code Status:  DNR Goals of care: Advanced Directive information Advanced Directives 04/04/2018  Does Patient Have a Medical Advance Directive? Yes  Type of Advance Directive Out of facility DNR (pink MOST or yellow form)  Does patient want to make changes to medical advance directive? No - Patient declined  Would patient like information on creating a medical advance directive? -  Pre-existing out of facility DNR order (yellow form or pink MOST form) -     Chief complaint-acute visit follow-up pelvic pain  HPI:  Pt is a 84 y.o. female seen today for an acute visit for pelvic pain with a history of pubic ramus fracture.   Patient is here for rehab after sustaining a closed fracture of the right pubis superior and inferior rami after a fall at home.  She also has a history of hypertension and superficial DVT diagnosed in December 2018.  She is completing an antibiotic- with a recent history of a UTI and pneumonia diagnosed on a recent visit to the emergency department.  This appears to be largely resolving her mental status is back at baseline she is somewhat more energetic she did present with lethargy and minimal responsiveness when she was sent to the ER.  Her Percocet was discontinued secondary to lethargy concerns-she is on Tylenol thousand milligrams 3 times a day and this appears to be helping- however with movement it appears she does have significant pain   I saw her working with the therapist today and she appeared to be okay as long as she was sitting  in bed but when she stood up and was weightbearing she did complain of significant discomfort.  Per therapy she appears to be participating well and doing relatively well all things considered.  Her Norvasc also was recently increased secondary to concerns of elevated blood pressure I got a manual reading of 136/70 this afternoon   Currently she does not really have any acute complaints other than pain when she stands up and moves- vital signs appear to be stable    Past Medical History:  Diagnosis Date  . Anxiety   . Headache   . Hypertension    Past Surgical History:  Procedure Laterality Date  . EYE SURGERY    . SKIN BIOPSY    . skin cancer removal      Allergies  Allergen Reactions  . Aspirin Nausea Only  . Neosporin [Neomycin-Bacitracin Zn-Polymyx] Rash    Outpatient Encounter Medications as of 04/04/2018  Medication Sig  . acetaminophen (TYLENOL) 500 MG tablet Take 1,000 mg by mouth every 8 (eight) hours as needed.   . ALPRAZolam (XANAX) 0.25 MG tablet Take 0.25 mg by mouth 2 (two) times daily as needed for anxiety. Take for 14 days from 03/30/2018-04/12/2018  . amLODipine (NORVASC) 2.5 MG tablet Take 2.5 mg by mouth daily. Give along with 5 mg to= 7.5 mg  . amLODipine (NORVASC) 5 MG tablet Take 5 mg by mouth daily. Along with 2.5 mg to = 7.5  . amoxicillin-clavulanate (AUGMENTIN) 400-57 MG/5ML suspension Give 11 ml by mouth  for PNA and UTI twice a day from 04/03/2018-04/07/2018  . carbamide peroxide (DEBROX) 6.5 % OTIC solution Place 5 drops into both ears daily as needed.  . Carboxymethylcellul-Glycerin (CLEAR EYES FOR DRY EYES) 1-0.25 % SOLN Place 1-2 drops into both eyes daily as needed.  . polyethylene glycol (MIRALAX / GLYCOLAX) packet Take 17 g by mouth daily as needed.  . senna-docusate (SENOKOT-S) 8.6-50 MG tablet Take 1 tablet by mouth 2 (two) times daily.  . shark liver oil-cocoa butter (PREPARATION H) 0.25-88.44 % suppository Place 1 suppository rectally as  needed for hemorrhoids.  . sodium chloride 0.9 % injection Flush peripheral IV with 3 cc normal saline before and after IV ABO administration twice a day.  . sodium chloride 0.9 % injection Flush peripheral IV with 3 cc normal saline q shift on shifts not receiving IV ABO twice a day  . [DISCONTINUED] ALPRAZolam (XANAX) 0.25 MG tablet Take 0.25 mg by mouth 2 (two) times daily as needed for anxiety.   . [DISCONTINUED] amLODipine (NORVASC) 5 MG tablet Take 1 tablet (5 mg total) by mouth daily. (Patient taking differently: Take 5 mg by mouth daily. Along with 2.5 mg to = 7.5)  . [DISCONTINUED] apixaban (ELIQUIS) 5 MG TABS tablet Take 14m po bid for 7 days then 570mpo bid  . [DISCONTINUED] cefTRIAXone Sodium (ROCEPHIN IV) Give 1 gram IV q 24 hours 7 days for UTI and pneumonia from 03/30/2018-04/05/2018  . [DISCONTINUED] sodium chloride 0.9 % injection Inject 3 mLs into the vein 2 (two) times daily. Before and after IV BO administration  . [DISCONTINUED] sodium chloride 0.9 % injection Inject 3 mLs into the vein 2 (two) times daily. Qshift on shifts not receiving IV ABO   No facility-administered encounter medications on file as of 04/04/2018.     Review of Systems   In general she is not complaining of fever chills.  Skin continues with very fragile skin but does not complain of rashes or itching.  Head ears eyes nose mouth and throat does not complain of visual changes or sore throat.  Respiratory does not complain of shortness of breath or cough.  She is finishing treatment for pneumonia.  Cardiac is not complaining of chest pain or significant edema.  GI does not complain of abdominal pain nausea vomiting diarrhea constipation.  Musculoskeletal does complain of  back pelvic discomfort at times with ambulation and weightbearing otherwise appears to be fairly well controlled  Neurologic does not complain of feeling dizzy does have baseline weakness does not complain of numbness or  headache.  Psych does not complain of being depressed appears to be feeling a bit more energetic- she does have Xanax as needed twice a day for anxiety    There is no immunization history on file for this patient. Pertinent  Health Maintenance Due  Topic Date Due  . INFLUENZA VACCINE  04/30/2018 (Originally 01/18/2018)  . DEXA SCAN  04/30/2018 (Originally 09/01/1990)  . PNA vac Low Risk Adult (1 of 2 - PCV13) 04/30/2018 (Originally 09[birth year]01/1990)   No flowsheet data found. Functional Status Survey:      Physical Exam   She is afebrile pulse 92 respirations of 17 blood pressure manually 136/70 weight is 96 pounds.  In general this is a frail elderly female in no distress sitting comfortably on the side of her bed.  Her skin is warm and dry.  Eyes visual acuity appears to be intact.  Oropharynx is clear mucous membranes moist.  Chest is clear to  auscultation with somewhat shallow air entry.  Heart is regular rate and rhythm without murmur gallop or rub she does not have significant lower extremity edema.  Abdomen is soft nontender with positive bowel sounds.  Musculoskeletal has general frailty she is able to stand without assistance although is weak- she does complain of back and pelvic pain when she does stand-.  Neurologic is grossly intact her speech is clear she continues have  weakness but has progress with therapy  Psych she is pleasant and appropriate does follow commands easily  Labs reviewed: Recent Labs    03/28/18 0631 03/30/18 1301 04/02/18 0300  NA 141 139 142  K 3.8 3.8 3.8  CL 102 100 108  CO2 '31 29 29  ' GLUCOSE 99 89 82  BUN 15 28* 23  CREATININE 0.88 1.09* 0.73  CALCIUM 10.0 10.0 9.2   Recent Labs    06/11/17 1050 03/28/18 0631  AST 24 24  ALT 14 19  ALKPHOS 69 72  BILITOT 0.6 0.7  PROT 7.0 7.5  ALBUMIN 3.7 4.0   Recent Labs    06/11/17 1050  03/28/18 0631 03/30/18 1301 04/02/18 0300 04/03/18 0700  WBC 8.2   < > 10.2 9.9 8.5 6.7    NEUTROABS 6.7  --  8.6* 8.8*  --   --   HGB 11.9*   < > 12.4 17.3* 9.7* 10.9*  HCT 39.3   < > 40.6 55.9* 32.5* 36.7  MCV 99.7   < > 100.0 97.2 99.1 98.9  PLT 264   < > 226 193 213 252   < > = values in this interval not displayed.   Lab Results  Component Value Date   TSH 2.413 07/25/2016   No results found for: HGBA1C No results found for: CHOL, HDL, LDLCALC, LDLDIRECT, TRIG, CHOLHDL  Significant Diagnostic Results in last 30 days:  Dg Chest 1 View  Result Date: 03/30/2018 CLINICAL DATA:  Altered mental status. EXAM: CHEST  1 VIEW COMPARISON:  Chest x-ray dated June 11, 2017. FINDINGS: Stable cardiomegaly. Atherosclerotic calcification of the aortic arch. The lungs remain hyperinflated with chronic bronchitic changes. New patchy airspace disease in the right greater than left upper lobes. No pleural effusion or pneumothorax. No acute osseous abnormality. IMPRESSION: 1. New patchy airspace disease in the bilateral upper lobes could reflect pneumonia or edema. Electronically Signed   By: Titus Dubin M.D.   On: 03/30/2018 13:23   Dg Shoulder Right  Result Date: 03/28/2018 CLINICAL DATA:  Pain after a fall. EXAM: RIGHT SHOULDER - 2+ VIEW COMPARISON:  None. FINDINGS: Diffuse bone demineralization. Degenerative changes in the right shoulder involving the glenohumeral joint. No evidence of acute fracture or subluxation. No focal bone lesion or bone destruction. Bone cortex and trabecular architecture appear intact. No radiopaque soft tissue foreign bodies. IMPRESSION: Degenerative changes in the right shoulder. No acute bony abnormalities. Electronically Signed   By: Lucienne Capers M.D.   On: 03/28/2018 07:03   Ct Head Wo Contrast  Result Date: 03/28/2018 CLINICAL DATA:  Unwitnessed fall at the 9 time. Pain all over. Struck back of head. EXAM: CT HEAD WITHOUT CONTRAST CT CERVICAL SPINE WITHOUT CONTRAST TECHNIQUE: Multidetector CT imaging of the head and cervical spine was performed  following the standard protocol without intravenous contrast. Multiplanar CT image reconstructions of the cervical spine were also generated. COMPARISON:  06/11/2017 FINDINGS: CT HEAD FINDINGS Brain: Diffuse cerebral atrophy. Ventricular dilatation consistent with central atrophy. Low-attenuation changes in the deep white matter consistent with small  vessel ischemic changes and old lacunar infarcts. No mass effect or midline shift. No abnormal extra-axial fluid collections. Gray-white matter junctions are distinct. Basal cisterns are not effaced. No acute intracranial hemorrhage. Vascular: Moderate intracranial vascular calcifications. Skull: Calvarium appears intact. No depressed skull fractures. Sinuses/Orbits: There is complete opacification of the left maxillary antrum with increased density centrally. This could represent chronic sinusitis or perhaps fungal sinusitis. No acute air-fluid levels. Mastoid air cells are clear. Other: None. CT CERVICAL SPINE FINDINGS Alignment: Normal alignment of the cervical vertebrae and facet joints. C1-2 articulation appears intact. Skull base and vertebrae: Skull base appears intact. No vertebral compression deformities. No focal bone lesions or bone destruction. Soft tissues and spinal canal: No prevertebral soft tissue swelling. No abnormal paraspinal soft tissue mass or infiltration. Disc levels: Degenerative changes throughout the cervical spine with narrowed interspaces and prominent endplate hypertrophic changes throughout. Degenerative changes throughout the facet joints and at C1-2. Upper chest: Lung apices are clear. Vascular calcifications. Other: None. IMPRESSION: 1. No acute intracranial abnormalities. Chronic atrophy and small vessel ischemic changes. 2. Normal alignment of the cervical spine. Diffuse degenerative changes. No acute displaced fractures identified. Electronically Signed   By: Lucienne Capers M.D.   On: 03/28/2018 06:53   Ct Cervical Spine Wo  Contrast  Result Date: 03/28/2018 CLINICAL DATA:  Unwitnessed fall at the 9 time. Pain all over. Struck back of head. EXAM: CT HEAD WITHOUT CONTRAST CT CERVICAL SPINE WITHOUT CONTRAST TECHNIQUE: Multidetector CT imaging of the head and cervical spine was performed following the standard protocol without intravenous contrast. Multiplanar CT image reconstructions of the cervical spine were also generated. COMPARISON:  06/11/2017 FINDINGS: CT HEAD FINDINGS Brain: Diffuse cerebral atrophy. Ventricular dilatation consistent with central atrophy. Low-attenuation changes in the deep white matter consistent with small vessel ischemic changes and old lacunar infarcts. No mass effect or midline shift. No abnormal extra-axial fluid collections. Gray-white matter junctions are distinct. Basal cisterns are not effaced. No acute intracranial hemorrhage. Vascular: Moderate intracranial vascular calcifications. Skull: Calvarium appears intact. No depressed skull fractures. Sinuses/Orbits: There is complete opacification of the left maxillary antrum with increased density centrally. This could represent chronic sinusitis or perhaps fungal sinusitis. No acute air-fluid levels. Mastoid air cells are clear. Other: None. CT CERVICAL SPINE FINDINGS Alignment: Normal alignment of the cervical vertebrae and facet joints. C1-2 articulation appears intact. Skull base and vertebrae: Skull base appears intact. No vertebral compression deformities. No focal bone lesions or bone destruction. Soft tissues and spinal canal: No prevertebral soft tissue swelling. No abnormal paraspinal soft tissue mass or infiltration. Disc levels: Degenerative changes throughout the cervical spine with narrowed interspaces and prominent endplate hypertrophic changes throughout. Degenerative changes throughout the facet joints and at C1-2. Upper chest: Lung apices are clear. Vascular calcifications. Other: None. IMPRESSION: 1. No acute intracranial abnormalities.  Chronic atrophy and small vessel ischemic changes. 2. Normal alignment of the cervical spine. Diffuse degenerative changes. No acute displaced fractures identified. Electronically Signed   By: Lucienne Capers M.D.   On: 03/28/2018 06:53   Ct Abdomen Pelvis W Contrast  Result Date: 03/30/2018 CLINICAL DATA:  Abdominal pain EXAM: CT ABDOMEN AND PELVIS WITH CONTRAST TECHNIQUE: Multidetector CT imaging of the abdomen and pelvis was performed using the standard protocol following bolus administration of intravenous contrast. CONTRAST:  29m ISOVUE-300 IOPAMIDOL (ISOVUE-300) INJECTION 61% COMPARISON:  None. FINDINGS: Lower chest: Mild dependent atelectatic changes are noted. Hepatobiliary: No focal liver abnormality is seen. No gallstones, gallbladder wall thickening, or biliary dilatation. Pancreas:  Unremarkable. No pancreatic ductal dilatation or surrounding inflammatory changes. Spleen: Normal in size without focal abnormality. Adrenals/Urinary Tract: Adrenal glands are within normal limits. Kidneys are well visualized with a normal enhancement pattern. Scattered small cysts are seen. No renal calculi or obstructive changes are seen. The bladder is partially distended. Stomach/Bowel: Stomach is within normal limits. Appendix appears normal. No evidence of bowel wall thickening, distention, or inflammatory changes. Vascular/Lymphatic: Aortic atherosclerosis. No enlarged abdominal or pelvic lymph nodes. Reproductive: Uterus and bilateral adnexa are unremarkable. Other: No abdominal wall hernia or abnormality. No abdominopelvic ascites. Musculoskeletal: Bilateral inferior and right superior pubic ramus fractures are noted. Associated right sacral fracture is seen superiorly. Degenerative changes of the lumbar spine are noted. IMPRESSION: Multiple pelvic fractures consistent with the given clinical history. No acute abdominal abnormality is identified. Electronically Signed   By: Inez Catalina M.D.   On: 03/30/2018  14:33   Dg Humerus Right  Result Date: 03/28/2018 CLINICAL DATA:  Pain after a fall. EXAM: RIGHT HUMERUS - 2+ VIEW COMPARISON:  None. FINDINGS: Diffuse bone demineralization. Right humerus appears intact. No evidence of acute fracture or subluxation. No focal bone lesion or bone destruction. Bone cortex and trabecular architecture appear intact. Soft tissues are unremarkable. No radiopaque soft tissue foreign bodies. IMPRESSION: Diffuse bone demineralization. No acute bony abnormalities. Electronically Signed   By: Lucienne Capers M.D.   On: 03/28/2018 07:03   Dg Hip Unilat W Or Wo Pelvis 2-3 Views Right  Result Date: 03/28/2018 CLINICAL DATA:  Patient fell at home today. Right shoulder and arm pain, lower back pain, and bilateral hip pain. EXAM: DG HIP (WITH OR WITHOUT PELVIS) 2-3V RIGHT COMPARISON:  06/11/2017 FINDINGS: Diffuse bone demineralization. Visualization of the sacrum and upper pelvis is limited due to bowel gas and stool. Degenerative changes in both hips. Fractures of the right superior and inferior pubic ramus with minimal displacement. No evidence of acute fracture or dislocation of the right hip. Vascular calcifications. IMPRESSION: Acute posttraumatic fractures of the right superior and inferior pubic rami. Diffuse bone demineralization. Electronically Signed   By: Lucienne Capers M.D.   On: 03/28/2018 07:02    Assessment/Plan   #1 history of pelvic rami fracture- she does receive Tylenol routinely every 8 hours-but apparently especially with motion she still has significant pain- she does not do well with stronger pain medication appears- will start with low-dose tramadol 25 mg twice daily as needed and monitor-this was discussed with her daughter Hassan Rowan as well.  2.  History of pneumonia she is finished her Zithromax and IV Rocephin-she is completing a course of Augmentin she appears to be doing well in this regards.  3.  UTI again she is on Augmentin which should cover the  UTI- she is status post IV Rocephin.  She appears to be stronger mental status is back at baseline.  4.  Hypertension Norvasc was just increased this appears to be relatively stable without a systolic in the 681L today at this point will monitor.  She is on Norvasc 7.5 mg a day.  5.  Anemia- it appearedrecent lab had her hemoglobin at over 17 but this appeared to be an outlier- subsequent lab 2 days ago it was down to 9.7 and a repeat was done yesterday which shows it is 10.9 which appears to be closer to her relative previous baseline which was in the 11-12 range    #6 history of dementia- CT scan of her head was negative for any acute process  A  MOST form was discussed with her daughter and has been completed which puts emphasis on comfort measures not transferring to hospital unless comfort needs cannot be met at current location- no IV fluids except for comfort insurance and no feeding tube and limitation of antibiotics when infection occurs.  [id number]

## 2018-04-05 ENCOUNTER — Encounter: Payer: Self-pay | Admitting: Internal Medicine

## 2018-04-05 ENCOUNTER — Other Ambulatory Visit: Payer: Self-pay

## 2018-04-05 DIAGNOSIS — S32591D Other specified fracture of right pubis, subsequent encounter for fracture with routine healing: Secondary | ICD-10-CM | POA: Diagnosis not present

## 2018-04-05 DIAGNOSIS — R296 Repeated falls: Secondary | ICD-10-CM | POA: Diagnosis not present

## 2018-04-05 DIAGNOSIS — E860 Dehydration: Secondary | ICD-10-CM | POA: Diagnosis not present

## 2018-04-05 DIAGNOSIS — M6281 Muscle weakness (generalized): Secondary | ICD-10-CM | POA: Diagnosis not present

## 2018-04-05 DIAGNOSIS — R279 Unspecified lack of coordination: Secondary | ICD-10-CM | POA: Diagnosis not present

## 2018-04-05 DIAGNOSIS — Z9181 History of falling: Secondary | ICD-10-CM | POA: Diagnosis not present

## 2018-04-05 MED ORDER — TRAMADOL HCL 50 MG PO TABS: 50.0000 mg | ORAL_TABLET | Freq: Two times a day (BID) | ORAL | 0 refills | Status: AC | PRN

## 2018-04-05 NOTE — Telephone Encounter (Signed)
RX Fax for [facility]@ [phone]  

## 2018-04-06 DIAGNOSIS — S32591D Other specified fracture of right pubis, subsequent encounter for fracture with routine healing: Secondary | ICD-10-CM | POA: Diagnosis not present

## 2018-04-06 DIAGNOSIS — R296 Repeated falls: Secondary | ICD-10-CM | POA: Diagnosis not present

## 2018-04-06 DIAGNOSIS — M6281 Muscle weakness (generalized): Secondary | ICD-10-CM | POA: Diagnosis not present

## 2018-04-06 DIAGNOSIS — Z9181 History of falling: Secondary | ICD-10-CM | POA: Diagnosis not present

## 2018-04-06 DIAGNOSIS — E860 Dehydration: Secondary | ICD-10-CM | POA: Diagnosis not present

## 2018-04-06 DIAGNOSIS — R279 Unspecified lack of coordination: Secondary | ICD-10-CM | POA: Diagnosis not present

## 2018-04-09 DIAGNOSIS — Z9181 History of falling: Secondary | ICD-10-CM | POA: Diagnosis not present

## 2018-04-09 DIAGNOSIS — R279 Unspecified lack of coordination: Secondary | ICD-10-CM | POA: Diagnosis not present

## 2018-04-09 DIAGNOSIS — M6281 Muscle weakness (generalized): Secondary | ICD-10-CM | POA: Diagnosis not present

## 2018-04-09 DIAGNOSIS — E860 Dehydration: Secondary | ICD-10-CM | POA: Diagnosis not present

## 2018-04-09 DIAGNOSIS — S32591D Other specified fracture of right pubis, subsequent encounter for fracture with routine healing: Secondary | ICD-10-CM | POA: Diagnosis not present

## 2018-04-09 DIAGNOSIS — R296 Repeated falls: Secondary | ICD-10-CM | POA: Diagnosis not present

## 2018-04-10 ENCOUNTER — Non-Acute Institutional Stay (SKILLED_NURSING_FACILITY): Payer: Medicare Other | Admitting: Internal Medicine

## 2018-04-10 ENCOUNTER — Encounter: Payer: Self-pay | Admitting: Internal Medicine

## 2018-04-10 DIAGNOSIS — I10 Essential (primary) hypertension: Secondary | ICD-10-CM

## 2018-04-10 DIAGNOSIS — R279 Unspecified lack of coordination: Secondary | ICD-10-CM | POA: Diagnosis not present

## 2018-04-10 DIAGNOSIS — M6281 Muscle weakness (generalized): Secondary | ICD-10-CM | POA: Diagnosis not present

## 2018-04-10 DIAGNOSIS — S32591D Other specified fracture of right pubis, subsequent encounter for fracture with routine healing: Secondary | ICD-10-CM | POA: Diagnosis not present

## 2018-04-10 DIAGNOSIS — Z9181 History of falling: Secondary | ICD-10-CM | POA: Diagnosis not present

## 2018-04-10 DIAGNOSIS — D649 Anemia, unspecified: Secondary | ICD-10-CM | POA: Diagnosis not present

## 2018-04-10 DIAGNOSIS — W19XXXS Unspecified fall, sequela: Secondary | ICD-10-CM | POA: Diagnosis not present

## 2018-04-10 DIAGNOSIS — R296 Repeated falls: Secondary | ICD-10-CM | POA: Diagnosis not present

## 2018-04-10 DIAGNOSIS — E860 Dehydration: Secondary | ICD-10-CM | POA: Diagnosis not present

## 2018-04-10 NOTE — Progress Notes (Addendum)
Location:    [facility] Room Number: 140/D Place of Service:  SNF [phone]) Provider: Veleta Miners MD  [name], MD  Patient Care Team: [name], MD as PCP - General [facility])  Extended Emergency Contact Information Primary Emergency Contact: Fargis,Brenda Address: [address] [place] [address] [place] Phone: [phone] Relation: Daughter  Code Status: DNR Goals of care: Advanced Directive information Advanced Directives 04/10/2018  Does Patient Have a Medical Advance Directive? Yes  Type of Advance Directive Out of facility DNR (pink MOST or yellow form)  Does patient want to make changes to medical advance directive? No - Patient declined  Would patient like information on creating a medical advance directive? -  Pre-existing out of facility DNR order (yellow form or pink MOST form) -     Chief Complaint  Patient presents with  . Acute Visit    F/U Anxiety    HPI:  Pt is a 84 y.o. female seen today for an acute visit for Follow up of her Anxiety  and her daughter wants to talk about discharge Planning.  She has h/o Hypertension and Superficial DVT Diagnosed in 12/18 Patient was brought to ED after fall at home. She fell while getting in the Bed. IT seems she was on the floor for few hours before her Yolanda Bonine found her. She was found to have Closed Fracture of Right Pubis both Superior and Inferior Rami. She was unable to put any weight on her legs. She was send to SNF for Therapy . In Facility patient was very confused and got Lethargic and was send back to ED where she was diagnosed with Pneumonia and UTI. She was started on IV rocephin and Zithromax. She was eventually switched to PO Augmentin. Patient has done well since then in the facility. She is working with the Therapy and is now able to stand up by herself. She is able to do Transfers with Mild assist. Her daughter is thinking to  take her home with 24 hour supervision She also wanted her Mom Xanax changed to Am . Patient herself did not have any acute issues. Her pain is controlled on Tylenol and Ultram  Past Medical History:  Diagnosis Date  . Anxiety   . Headache   . Hypertension    Past Surgical History:  Procedure Laterality Date  . EYE SURGERY    . SKIN BIOPSY    . skin cancer removal      Allergies  Allergen Reactions  . Aspirin Nausea Only  . Neosporin [Neomycin-Bacitracin Zn-Polymyx] Rash    Outpatient Encounter Medications as of 04/10/2018  Medication Sig  . acetaminophen (TYLENOL) 500 MG tablet Take 1,000 mg by mouth every 8 (eight) hours as needed.   . ALPRAZolam (XANAX) 0.25 MG tablet Take 0.25 mg by mouth 2 (two) times daily as needed for anxiety. Take for 14 days from 03/30/2018-04/12/2018  . amLODipine (NORVASC) 2.5 MG tablet Take 2.5 mg by mouth daily. Give along with 5 mg to= 7.5 mg  . amLODipine (NORVASC) 5 MG tablet Take 5 mg by mouth daily. Along with 2.5 mg to = 7.5  . carbamide peroxide (DEBROX) 6.5 % OTIC solution Place 5 drops into both ears daily as needed.  . Carboxymethylcellul-Glycerin (CLEAR EYES FOR DRY EYES) 1-0.25 % SOLN Place 1-2 drops into both eyes daily as needed.  . polyethylene glycol (MIRALAX / GLYCOLAX) packet Take 17 g  by mouth daily as needed.  . senna-docusate (SENOKOT-S) 8.6-50 MG tablet Take 1 tablet by mouth 2 (two) times daily.  . shark liver oil-cocoa butter (PREPARATION H) 0.25-88.44 % suppository Place 1 suppository rectally as needed for hemorrhoids.  . traMADol (ULTRAM) 50 MG tablet Take 1 tablet (50 mg total) by mouth 2 (two) times daily as needed.  . [DISCONTINUED] amoxicillin-clavulanate (AUGMENTIN) 400-57 MG/5ML suspension Give 11 ml by mouth for PNA and UTI twice a day from 04/03/2018-04/07/2018  . [DISCONTINUED] sodium chloride 0.9 % injection Flush peripheral IV with 3 cc normal saline before and after IV ABO administration twice a day.  .  [DISCONTINUED] sodium chloride 0.9 % injection Flush peripheral IV with 3 cc normal saline q shift on shifts not receiving IV ABO twice a day   No facility-administered encounter medications on file as of 04/10/2018.      Review of Systems  Review of Systems  Constitutional: Negative for activity change, appetite change, chills, diaphoresis, fatigue and fever.  HENT: Negative for mouth sores, postnasal drip, rhinorrhea, sinus pain and sore throat.   Respiratory: Negative for apnea, cough, chest tightness, shortness of breath and wheezing.   Cardiovascular: Negative for chest pain, palpitations and leg swelling.  Gastrointestinal: Negative for abdominal distention, abdominal pain, constipation, diarrhea, nausea and vomiting.  Genitourinary: Negative for dysuria and frequency.  Musculoskeletal: Negative for arthralgias, joint swelling and myalgias.  Skin: Negative for rash.  Neurological: Negative for dizziness, syncope, weakness, light-headedness and numbness.  Psychiatric/Behavioral: Negative for behavioral problems, confusion and sleep disturbance.      There is no immunization history on file for this patient. Pertinent  Health Maintenance Due  Topic Date Due  . INFLUENZA VACCINE  04/30/2018 (Originally 01/18/2018)  . DEXA SCAN  04/30/2018 (Originally 09/01/1990)  . PNA vac Low Risk Adult (1 of 2 - PCV13) 04/30/2018 (Originally 09[birth year]01/1990)   No flowsheet data found. Functional Status Survey:    Vitals:   04/10/18 1430  BP: (!) 150/62  Pulse: 71  Resp: 18  Temp: (!) 97.4 F (36.3 C)   There is no height or weight on file to calculate BMI. Physical Exam  Constitutional: She appears well-developed.  Very Frail  HENT:  Head: Normocephalic.  Mouth/Throat: Oropharynx is clear and moist.  Eyes: Pupils are equal, round, and reactive to light.  Neck: Neck supple.  Cardiovascular: Normal rate and regular rhythm.  No murmur heard. Pulmonary/Chest: Effort normal and breath  sounds normal. No stridor. No respiratory distress. She has no wheezes.  Abdominal: Soft. Bowel sounds are normal. She exhibits no distension. There is no tenderness. There is no guarding.  Musculoskeletal: She exhibits no edema.  Neurological: She is alert.  Was very alert today and following Commands  Skin: Skin is warm and dry.  Psychiatric: She has a normal mood and affect. Her behavior is normal. Thought content normal.    Labs reviewed: Recent Labs    03/28/18 0631 03/30/18 1301 04/02/18 0300  NA 141 139 142  K 3.8 3.8 3.8  CL 102 100 108  CO2 31 29 29   GLUCOSE 99 89 82  BUN 15 28* 23  CREATININE 0.88 1.09* 0.73  CALCIUM 10.0 10.0 9.2   Recent Labs    06/11/17 1050 03/28/18 0631  AST 24 24  ALT 14 19  ALKPHOS 69 72  BILITOT 0.6 0.7  PROT 7.0 7.5  ALBUMIN 3.7 4.0   Recent Labs    06/11/17 1050  03/28/18 0631 03/30/18 1301 04/02/18 0300 04/03/18  0700  WBC 8.2   < > 10.2 9.9 8.5 6.7  NEUTROABS 6.7  --  8.6* 8.8*  --   --   HGB 11.9*   < > 12.4 17.3* 9.7* 10.9*  HCT 39.3   < > 40.6 55.9* 32.5* 36.7  MCV 99.7   < > 100.0 97.2 99.1 98.9  PLT 264   < > 226 193 213 252   < > = values in this interval not displayed.   Lab Results  Component Value Date   TSH 2.413 07/25/2016   No results found for: HGBA1C No results found for: CHOL, HDL, LDLCALC, LDLDIRECT, TRIG, CHOLHDL  Significant Diagnostic Results in last 30 days:  Dg Chest 1 View  Result Date: 03/30/2018 CLINICAL DATA:  Altered mental status. EXAM: CHEST  1 VIEW COMPARISON:  Chest x-ray dated June 11, 2017. FINDINGS: Stable cardiomegaly. Atherosclerotic calcification of the aortic arch. The lungs remain hyperinflated with chronic bronchitic changes. New patchy airspace disease in the right greater than left upper lobes. No pleural effusion or pneumothorax. No acute osseous abnormality. IMPRESSION: 1. New patchy airspace disease in the bilateral upper lobes could reflect pneumonia or edema.  Electronically Signed   By: Titus Dubin M.D.   On: 03/30/2018 13:23   Dg Shoulder Right  Result Date: 03/28/2018 CLINICAL DATA:  Pain after a fall. EXAM: RIGHT SHOULDER - 2+ VIEW COMPARISON:  None. FINDINGS: Diffuse bone demineralization. Degenerative changes in the right shoulder involving the glenohumeral joint. No evidence of acute fracture or subluxation. No focal bone lesion or bone destruction. Bone cortex and trabecular architecture appear intact. No radiopaque soft tissue foreign bodies. IMPRESSION: Degenerative changes in the right shoulder. No acute bony abnormalities. Electronically Signed   By: Lucienne Capers M.D.   On: 03/28/2018 07:03   Ct Head Wo Contrast  Result Date: 03/28/2018 CLINICAL DATA:  Unwitnessed fall at the 9 time. Pain all over. Struck back of head. EXAM: CT HEAD WITHOUT CONTRAST CT CERVICAL SPINE WITHOUT CONTRAST TECHNIQUE: Multidetector CT imaging of the head and cervical spine was performed following the standard protocol without intravenous contrast. Multiplanar CT image reconstructions of the cervical spine were also generated. COMPARISON:  06/11/2017 FINDINGS: CT HEAD FINDINGS Brain: Diffuse cerebral atrophy. Ventricular dilatation consistent with central atrophy. Low-attenuation changes in the deep white matter consistent with small vessel ischemic changes and old lacunar infarcts. No mass effect or midline shift. No abnormal extra-axial fluid collections. Gray-white matter junctions are distinct. Basal cisterns are not effaced. No acute intracranial hemorrhage. Vascular: Moderate intracranial vascular calcifications. Skull: Calvarium appears intact. No depressed skull fractures. Sinuses/Orbits: There is complete opacification of the left maxillary antrum with increased density centrally. This could represent chronic sinusitis or perhaps fungal sinusitis. No acute air-fluid levels. Mastoid air cells are clear. Other: None. CT CERVICAL SPINE FINDINGS Alignment: Normal  alignment of the cervical vertebrae and facet joints. C1-2 articulation appears intact. Skull base and vertebrae: Skull base appears intact. No vertebral compression deformities. No focal bone lesions or bone destruction. Soft tissues and spinal canal: No prevertebral soft tissue swelling. No abnormal paraspinal soft tissue mass or infiltration. Disc levels: Degenerative changes throughout the cervical spine with narrowed interspaces and prominent endplate hypertrophic changes throughout. Degenerative changes throughout the facet joints and at C1-2. Upper chest: Lung apices are clear. Vascular calcifications. Other: None. IMPRESSION: 1. No acute intracranial abnormalities. Chronic atrophy and small vessel ischemic changes. 2. Normal alignment of the cervical spine. Diffuse degenerative changes. No acute displaced fractures identified. Electronically  Signed   By: Lucienne Capers M.D.   On: 03/28/2018 06:53   Ct Cervical Spine Wo Contrast  Result Date: 03/28/2018 CLINICAL DATA:  Unwitnessed fall at the 9 time. Pain all over. Struck back of head. EXAM: CT HEAD WITHOUT CONTRAST CT CERVICAL SPINE WITHOUT CONTRAST TECHNIQUE: Multidetector CT imaging of the head and cervical spine was performed following the standard protocol without intravenous contrast. Multiplanar CT image reconstructions of the cervical spine were also generated. COMPARISON:  06/11/2017 FINDINGS: CT HEAD FINDINGS Brain: Diffuse cerebral atrophy. Ventricular dilatation consistent with central atrophy. Low-attenuation changes in the deep white matter consistent with small vessel ischemic changes and old lacunar infarcts. No mass effect or midline shift. No abnormal extra-axial fluid collections. Gray-white matter junctions are distinct. Basal cisterns are not effaced. No acute intracranial hemorrhage. Vascular: Moderate intracranial vascular calcifications. Skull: Calvarium appears intact. No depressed skull fractures. Sinuses/Orbits: There is  complete opacification of the left maxillary antrum with increased density centrally. This could represent chronic sinusitis or perhaps fungal sinusitis. No acute air-fluid levels. Mastoid air cells are clear. Other: None. CT CERVICAL SPINE FINDINGS Alignment: Normal alignment of the cervical vertebrae and facet joints. C1-2 articulation appears intact. Skull base and vertebrae: Skull base appears intact. No vertebral compression deformities. No focal bone lesions or bone destruction. Soft tissues and spinal canal: No prevertebral soft tissue swelling. No abnormal paraspinal soft tissue mass or infiltration. Disc levels: Degenerative changes throughout the cervical spine with narrowed interspaces and prominent endplate hypertrophic changes throughout. Degenerative changes throughout the facet joints and at C1-2. Upper chest: Lung apices are clear. Vascular calcifications. Other: None. IMPRESSION: 1. No acute intracranial abnormalities. Chronic atrophy and small vessel ischemic changes. 2. Normal alignment of the cervical spine. Diffuse degenerative changes. No acute displaced fractures identified. Electronically Signed   By: Lucienne Capers M.D.   On: 03/28/2018 06:53   Ct Abdomen Pelvis W Contrast  Result Date: 03/30/2018 CLINICAL DATA:  Abdominal pain EXAM: CT ABDOMEN AND PELVIS WITH CONTRAST TECHNIQUE: Multidetector CT imaging of the abdomen and pelvis was performed using the standard protocol following bolus administration of intravenous contrast. CONTRAST:  43mL ISOVUE-300 IOPAMIDOL (ISOVUE-300) INJECTION 61% COMPARISON:  None. FINDINGS: Lower chest: Mild dependent atelectatic changes are noted. Hepatobiliary: No focal liver abnormality is seen. No gallstones, gallbladder wall thickening, or biliary dilatation. Pancreas: Unremarkable. No pancreatic ductal dilatation or surrounding inflammatory changes. Spleen: Normal in size without focal abnormality. Adrenals/Urinary Tract: Adrenal glands are within  normal limits. Kidneys are well visualized with a normal enhancement pattern. Scattered small cysts are seen. No renal calculi or obstructive changes are seen. The bladder is partially distended. Stomach/Bowel: Stomach is within normal limits. Appendix appears normal. No evidence of bowel wall thickening, distention, or inflammatory changes. Vascular/Lymphatic: Aortic atherosclerosis. No enlarged abdominal or pelvic lymph nodes. Reproductive: Uterus and bilateral adnexa are unremarkable. Other: No abdominal wall hernia or abnormality. No abdominopelvic ascites. Musculoskeletal: Bilateral inferior and right superior pubic ramus fractures are noted. Associated right sacral fracture is seen superiorly. Degenerative changes of the lumbar spine are noted. IMPRESSION: Multiple pelvic fractures consistent with the given clinical history. No acute abdominal abnormality is identified. Electronically Signed   By: Inez Catalina M.D.   On: 03/30/2018 14:33   Dg Humerus Right  Result Date: 03/28/2018 CLINICAL DATA:  Pain after a fall. EXAM: RIGHT HUMERUS - 2+ VIEW COMPARISON:  None. FINDINGS: Diffuse bone demineralization. Right humerus appears intact. No evidence of acute fracture or subluxation. No focal bone lesion or bone  destruction. Bone cortex and trabecular architecture appear intact. Soft tissues are unremarkable. No radiopaque soft tissue foreign bodies. IMPRESSION: Diffuse bone demineralization. No acute bony abnormalities. Electronically Signed   By: Lucienne Capers M.D.   On: 03/28/2018 07:03   Dg Hip Unilat W Or Wo Pelvis 2-3 Views Right  Result Date: 03/28/2018 CLINICAL DATA:  Patient fell at home today. Right shoulder and arm pain, lower back pain, and bilateral hip pain. EXAM: DG HIP (WITH OR WITHOUT PELVIS) 2-3V RIGHT COMPARISON:  06/11/2017 FINDINGS: Diffuse bone demineralization. Visualization of the sacrum and upper pelvis is limited due to bowel gas and stool. Degenerative changes in both hips.  Fractures of the right superior and inferior pubic ramus with minimal displacement. No evidence of acute fracture or dislocation of the right hip. Vascular calcifications. IMPRESSION: Acute posttraumatic fractures of the right superior and inferior pubic rami. Diffuse bone demineralization. Electronically Signed   By: Lucienne Capers M.D.   On: 03/28/2018 07:02    Assessment/Plan Pneumonia She finished course of Augmentin Afebrile and doing well UTI Augmentin   S/P Pelvic Rami Fracture Tylenol and Ultram for pain Ortho consult was cancelled due to Daughters request Patient is working with therapy Hypertension BP better controlled on Amlodipine  Dose was increased in last visit Anxiety Daughter requesting her mom to get Xanax PRN in am ? Superficial DVT in 12/18 Per daughter Patient did take her Eliquis for 3 months.  Was  discontinued in SNF   especially since her daughter says she has h/o Recurrent Falls. Anemia Hgb Stable Repeat CBC Dementia with ACP. Patient had CT scan of head  which was negative or any acute process. Daughter does understand that patient will need more Supervision at home. Daughter has signed MOST form for Facility for [facility] for her Mom. She is planning to take her Home next week if Patient continues to make progress with therapy   Family/ staff Communication:   Labs/tests ordered:

## 2018-04-11 DIAGNOSIS — E860 Dehydration: Secondary | ICD-10-CM | POA: Diagnosis not present

## 2018-04-11 DIAGNOSIS — R279 Unspecified lack of coordination: Secondary | ICD-10-CM | POA: Diagnosis not present

## 2018-04-11 DIAGNOSIS — Z9181 History of falling: Secondary | ICD-10-CM | POA: Diagnosis not present

## 2018-04-11 DIAGNOSIS — R296 Repeated falls: Secondary | ICD-10-CM | POA: Diagnosis not present

## 2018-04-11 DIAGNOSIS — M6281 Muscle weakness (generalized): Secondary | ICD-10-CM | POA: Diagnosis not present

## 2018-04-11 DIAGNOSIS — S32591D Other specified fracture of right pubis, subsequent encounter for fracture with routine healing: Secondary | ICD-10-CM | POA: Diagnosis not present

## 2018-04-12 DIAGNOSIS — Z9181 History of falling: Secondary | ICD-10-CM | POA: Diagnosis not present

## 2018-04-12 DIAGNOSIS — M6281 Muscle weakness (generalized): Secondary | ICD-10-CM | POA: Diagnosis not present

## 2018-04-12 DIAGNOSIS — E860 Dehydration: Secondary | ICD-10-CM | POA: Diagnosis not present

## 2018-04-12 DIAGNOSIS — R296 Repeated falls: Secondary | ICD-10-CM | POA: Diagnosis not present

## 2018-04-12 DIAGNOSIS — R279 Unspecified lack of coordination: Secondary | ICD-10-CM | POA: Diagnosis not present

## 2018-04-12 DIAGNOSIS — S32591D Other specified fracture of right pubis, subsequent encounter for fracture with routine healing: Secondary | ICD-10-CM | POA: Diagnosis not present

## 2018-04-13 ENCOUNTER — Non-Acute Institutional Stay (SKILLED_NURSING_FACILITY): Payer: Medicare Other | Admitting: Internal Medicine

## 2018-04-13 ENCOUNTER — Encounter (HOSPITAL_COMMUNITY)
Admission: RE | Admit: 2018-04-13 | Discharge: 2018-04-13 | Disposition: A | Discharge status: Home / Self Care | Payer: Medicare Other | Source: Skilled Nursing Facility | Attending: *Deleted | Admitting: *Deleted

## 2018-04-13 ENCOUNTER — Encounter: Payer: Self-pay | Admitting: Internal Medicine

## 2018-04-13 DIAGNOSIS — M6281 Muscle weakness (generalized): Secondary | ICD-10-CM | POA: Diagnosis not present

## 2018-04-13 DIAGNOSIS — R531 Weakness: Secondary | ICD-10-CM

## 2018-04-13 DIAGNOSIS — I10 Essential (primary) hypertension: Secondary | ICD-10-CM | POA: Diagnosis not present

## 2018-04-13 DIAGNOSIS — S32591D Other specified fracture of right pubis, subsequent encounter for fracture with routine healing: Secondary | ICD-10-CM

## 2018-04-13 DIAGNOSIS — Z9181 History of falling: Secondary | ICD-10-CM | POA: Diagnosis not present

## 2018-04-13 DIAGNOSIS — R296 Repeated falls: Secondary | ICD-10-CM | POA: Diagnosis not present

## 2018-04-13 DIAGNOSIS — R279 Unspecified lack of coordination: Secondary | ICD-10-CM | POA: Diagnosis not present

## 2018-04-13 DIAGNOSIS — R102 Pelvic and perineal pain: Secondary | ICD-10-CM | POA: Diagnosis not present

## 2018-04-13 DIAGNOSIS — E860 Dehydration: Secondary | ICD-10-CM | POA: Diagnosis not present

## 2018-04-13 DIAGNOSIS — R079 Chest pain, unspecified: Secondary | ICD-10-CM | POA: Diagnosis not present

## 2018-04-13 DIAGNOSIS — M533 Sacrococcygeal disorders, not elsewhere classified: Secondary | ICD-10-CM | POA: Diagnosis not present

## 2018-04-13 DIAGNOSIS — M25551 Pain in right hip: Secondary | ICD-10-CM | POA: Diagnosis not present

## 2018-04-13 DIAGNOSIS — R55 Syncope and collapse: Secondary | ICD-10-CM | POA: Diagnosis not present

## 2018-04-13 DIAGNOSIS — M25552 Pain in left hip: Secondary | ICD-10-CM | POA: Diagnosis not present

## 2018-04-13 DIAGNOSIS — M545 Low back pain: Secondary | ICD-10-CM | POA: Diagnosis not present

## 2018-04-13 LAB — CBC WITH DIFFERENTIAL/PLATELET
Abs Immature Granulocytes: 0.04 10*3/uL (ref 0.00–0.07)
Basophils Absolute: 0.1 10*3/uL (ref 0.0–0.1)
Basophils Relative: 0 %
EOS ABS: 0.1 10*3/uL (ref 0.0–0.5)
Eosinophils Relative: 1 %
HEMATOCRIT: 34.7 % — AB (ref 36.0–46.0)
Hemoglobin: 10.3 g/dL — ABNORMAL LOW (ref 12.0–15.0)
Immature Granulocytes: 0 %
LYMPHS ABS: 0.6 10*3/uL — AB (ref 0.7–4.0)
Lymphocytes Relative: 6 %
MCH: 29.4 pg (ref 26.0–34.0)
MCHC: 29.7 g/dL — ABNORMAL LOW (ref 30.0–36.0)
MCV: 99.1 fL (ref 80.0–100.0)
MONO ABS: 0.4 10*3/uL (ref 0.1–1.0)
MONOS PCT: 4 %
Neutro Abs: 10.3 10*3/uL — ABNORMAL HIGH (ref 1.7–7.7)
Neutrophils Relative %: 89 %
PLATELETS: 475 10*3/uL — AB (ref 150–400)
RBC: 3.5 MIL/uL — ABNORMAL LOW (ref 3.87–5.11)
RDW: 13.6 % (ref 11.5–15.5)
WBC: 11.5 10*3/uL — ABNORMAL HIGH (ref 4.0–10.5)
nRBC: 0 % (ref 0.0–0.2)

## 2018-04-13 LAB — COMPREHENSIVE METABOLIC PANEL
ALT: 16 U/L (ref 0–44)
ANION GAP: 7 (ref 5–15)
AST: 21 U/L (ref 15–41)
Albumin: 3.3 g/dL — ABNORMAL LOW (ref 3.5–5.0)
Alkaline Phosphatase: 281 U/L — ABNORMAL HIGH (ref 38–126)
BILIRUBIN TOTAL: 0.5 mg/dL (ref 0.3–1.2)
BUN: 25 mg/dL — ABNORMAL HIGH (ref 8–23)
CALCIUM: 9.5 mg/dL (ref 8.9–10.3)
CHLORIDE: 102 mmol/L (ref 98–111)
CO2: 30 mmol/L (ref 22–32)
Creatinine, Ser: 0.81 mg/dL (ref 0.44–1.00)
GFR calc Af Amer: >60 mL/min (ref >60)
GFR calc non Af Amer: >60 mL/min (ref >60)
Glucose, Bld: 132 mg/dL — ABNORMAL HIGH (ref 70–99)
Potassium: 4.5 mmol/L (ref 3.5–5.1)
Sodium: 139 mmol/L (ref 135–145)
Total Protein: 7 g/dL (ref 6.5–8.1)

## 2018-04-13 LAB — URINALYSIS, ROUTINE W REFLEX MICROSCOPIC
Bilirubin Urine: NEGATIVE
Glucose, UA: NEGATIVE mg/dL
Hgb urine dipstick: NEGATIVE
Ketones, ur: NEGATIVE mg/dL
Nitrite: NEGATIVE
PH: 6 (ref 5.0–8.0)
Protein, ur: NEGATIVE mg/dL
SPECIFIC GRAVITY, URINE: 1.019 (ref 1.005–1.030)

## 2018-04-13 NOTE — Progress Notes (Signed)
Location:    [facility] Room Number: 140/D Place of Service:  SNF (31) Provider:  Lilia Pro, MD  Patient Care Team: [name], MD as PCP - General [facility])  Extended Emergency Contact Information Primary Emergency Contact: Fargis,Brenda Address: [address] [place] [address] [place] Phone: [phone] Relation: Daughter  Code Status:  DNR Goals of care: Advanced Directive information Advanced Directives 04/13/2018  Does Patient Have a Medical Advance Directive? Yes  Type of Advance Directive Out of facility DNR (pink MOST or yellow form)  Does patient want to make changes to medical advance directive? No - Patient declined  Would patient like information on creating a medical advance directive? -  Pre-existing out of facility DNR order (yellow form or pink MOST form) -    Chief complaint-acute visit secondary to possible syncopal episode   HPI:  Pt is a 84 y.o. female seen today for an acute visit for transient altered mental status weakness possible syncope while working at bedside with therapy.   She has a previous history of hypertension as well as superficial DVT diagnosed in December 2018 as well as a recent pelvic fracture.  She really brought to the emergency department after a fall at home- she was found to have a closed fracture of the right pubis both superior and inferior rami.  She was sent to skilled nursing for therapy.  She did have an episode with increased lethargy and confusion and was sent to the ED where she was diagnosed with pneumonia and UTI she was started on IV Rocephin and Zithromax she completed her antibiotics here and appeared to be improving working more with therapy getting stronger.  Apparently today while working with therapy she did have what appeared to be possibly a syncopal episode with increased weakness and altered mental status she was  quickly put back to bed and when I arrived in the room she was alert talking appeared weak but clinically stable.  Vital signs were stable systolic was somewhat elevated at around 170 but on reevaluation this did come down to 140.  Pulse was around 60.  She complained of somewhat diffuse bone pain which is not a new complaint- did not really specifically complain of chest pain or shortness of breath.  Neurologically she appeared to be intact her speech was clear.  When I reevaluated her sometime later her blood pressure had come down to 140/52 pulse was regular in the low 60s- she appeared to be comfortable lying in bed she was alert neurologically intact again complained of some diffuse somewhat bone pain but otherwise did not really have any complaints  We did order an EKG which did not really show any acute changes it did show minimal sinus bradycardia with a rate of 59.  Update labs are pending as well as a chest x-ray and urine  After initial episode this morning I did discuss with patient and daughter about going to the ER but patient stated she did not really want to go to the ER- and her daughter has filled out a most form with emphasis on comfort measures  Past Medical History:  Diagnosis Date  . Anxiety   . Headache   . Hypertension    Past Surgical History:  Procedure Laterality Date  . EYE SURGERY    . SKIN BIOPSY    . skin cancer removal  Allergies  Allergen Reactions  . Aspirin Nausea Only  . Neosporin [Neomycin-Bacitracin Zn-Polymyx] Rash    Outpatient Encounter Medications as of 04/13/2018  Medication Sig  . acetaminophen (TYLENOL) 500 MG tablet Take 1,000 mg by mouth every 8 (eight) hours as needed.   Marland Kitchen amLODipine (NORVASC) 2.5 MG tablet Take 2.5 mg by mouth daily. Give along with 5 mg to= 7.5 mg  . amLODipine (NORVASC) 5 MG tablet Take 5 mg by mouth daily. Along with 2.5 mg to = 7.5  . carbamide peroxide (DEBROX) 6.5 % OTIC solution Place 5 drops into  both ears daily as needed.  . Carboxymethylcellul-Glycerin (CLEAR EYES FOR DRY EYES) 1-0.25 % SOLN Place 1-2 drops into both eyes daily as needed.  . polyethylene glycol (MIRALAX / GLYCOLAX) packet Take 17 g by mouth daily as needed.  . senna-docusate (SENOKOT-S) 8.6-50 MG tablet Take 1 tablet by mouth 2 (two) times daily.  . shark liver oil-cocoa butter (PREPARATION H) 0.25-88.44 % suppository Place 1 suppository rectally as needed for hemorrhoids.  . traMADol (ULTRAM) 50 MG tablet Take 1 tablet (50 mg total) by mouth 2 (two) times daily as needed.  . [DISCONTINUED] ALPRAZolam (XANAX) 0.25 MG tablet Take 0.25 mg by mouth 2 (two) times daily as needed for anxiety. Take for 14 days from 03/30/2018-04/12/2018   No facility-administered encounter medications on file as of 04/13/2018.     Review of Systems In general she is complaining of weakness but not of headache or dizziness at this time.  Skin does not complain of rashes or itching does appear to be somewhat clammy.  Head ears eyes nose mouth and throat does not complain of visual changes or sore throat or difficulty swallowing.  Respiratory does not complain of being shortness of breath or having a cough.  Cardiac does not really complain of chest pain.  GI does not complain of abdominal discomfort nausea or vomiting or diarrhea.  Musculoskeletal has somewhat diffuse complaints of joint pain more so of her shoulders and back which is somewhat chronic  Neurologic complaints of weakness but does not really complain of dizziness or headache  Psych- appears to be somewhat anxious at times does not appear overtly anxious at this time  There is no immunization history on file for this patient. Pertinent  Health Maintenance Due  Topic Date Due  . INFLUENZA VACCINE  04/30/2018 (Originally 01/18/2018)  . DEXA SCAN  04/30/2018 (Originally 09/01/1990)  . PNA vac Low Risk Adult (1 of 2 - PCV13) 04/30/2018 (Originally [birth year]01/1988)   No  flowsheet data found. Functional Status Survey:    Vitals:   04/13/18 1259  BP: (!) 168/60  Pulse: 60  Temperature is 97.0 respirations of 18- O2 saturation was in the mid 90s on room air On reevaluation blood pressure had come down to 140/52- pulse was in the low 60s Physical Exam   In general this is a frail appearing elderly female in no distress appeared quite weak on initial exam but as time progress appeared to be feeling somewhat stronger  Eyes visual acuity appears to be intact sclera and conjunctive are clear pupils are equal round reactive to light.  Oropharynx is clear mucous membranes fairly moist tongue has full range of motion.  Chest is clear to auscultation there is no labored breathing is somewhat shallow air entry.  Heart is regular rate and rhythm at times borderline bradycardic in the high 50s-she does not have significant lower extremity edema.  Abdomen is soft nontender with positive  bowel sounds.  Musculoskeletal Limited exam since she is in bed but appears to be at baseline grip strength appears to be intact somewhat weak but this is not new is able to move her arms at baseline with limited range of motion at the shoulders which is not new-is able to lift her legs bilaterally and press down on my hand with her feet bilaterally this appears to be relatively baseline.  Neurologic as noted above she is alert cranial nerves appear to be intact her speech is clear could not really appreciate lateralizing findings as noted above.  Psych she is pleasant and appropriate follow simple verbal commands without difficulty  Labs reviewed: Recent Labs    03/28/18 0631 03/30/18 1301 04/02/18 0300  NA 141 139 142  K 3.8 3.8 3.8  CL 102 100 108  CO2 31 29 29   GLUCOSE 99 89 82  BUN 15 28* 23  CREATININE 0.88 1.09* 0.73  CALCIUM 10.0 10.0 9.2   Recent Labs    06/11/17 1050 03/28/18 0631  AST 24 24  ALT 14 19  ALKPHOS 69 72  BILITOT 0.6 0.7  PROT 7.0 7.5    ALBUMIN 3.7 4.0   Recent Labs    06/11/17 1050  03/28/18 0631 03/30/18 1301 04/02/18 0300 04/03/18 0700  WBC 8.2   < > 10.2 9.9 8.5 6.7  NEUTROABS 6.7  --  8.6* 8.8*  --   --   HGB 11.9*   < > 12.4 17.3* 9.7* 10.9*  HCT 39.3   < > 40.6 55.9* 32.5* 36.7  MCV 99.7   < > 100.0 97.2 99.1 98.9  PLT 264   < > 226 193 213 252   < > = values in this interval not displayed.   Lab Results  Component Value Date   TSH 2.413 07/25/2016   No results found for: HGBA1C No results found for: CHOL, HDL, LDLCALC, LDLDIRECT, TRIG, CHOLHDL  Significant Diagnostic Results in last 30 days:  Dg Chest 1 View  Result Date: 03/30/2018 CLINICAL DATA:  Altered mental status. EXAM: CHEST  1 VIEW COMPARISON:  Chest x-ray dated June 11, 2017. FINDINGS: Stable cardiomegaly. Atherosclerotic calcification of the aortic arch. The lungs remain hyperinflated with chronic bronchitic changes. New patchy airspace disease in the right greater than left upper lobes. No pleural effusion or pneumothorax. No acute osseous abnormality. IMPRESSION: 1. New patchy airspace disease in the bilateral upper lobes could reflect pneumonia or edema. Electronically Signed   By: Titus Dubin M.D.   On: 03/30/2018 13:23   Dg Shoulder Right  Result Date: 03/28/2018 CLINICAL DATA:  Pain after a fall. EXAM: RIGHT SHOULDER - 2+ VIEW COMPARISON:  None. FINDINGS: Diffuse bone demineralization. Degenerative changes in the right shoulder involving the glenohumeral joint. No evidence of acute fracture or subluxation. No focal bone lesion or bone destruction. Bone cortex and trabecular architecture appear intact. No radiopaque soft tissue foreign bodies. IMPRESSION: Degenerative changes in the right shoulder. No acute bony abnormalities. Electronically Signed   By: Lucienne Capers M.D.   On: 03/28/2018 07:03   Ct Head Wo Contrast  Result Date: 03/28/2018 CLINICAL DATA:  Unwitnessed fall at the 9 time. Pain all over. Struck back of head.  EXAM: CT HEAD WITHOUT CONTRAST CT CERVICAL SPINE WITHOUT CONTRAST TECHNIQUE: Multidetector CT imaging of the head and cervical spine was performed following the standard protocol without intravenous contrast. Multiplanar CT image reconstructions of the cervical spine were also generated. COMPARISON:  06/11/2017 FINDINGS: CT HEAD FINDINGS  Brain: Diffuse cerebral atrophy. Ventricular dilatation consistent with central atrophy. Low-attenuation changes in the deep white matter consistent with small vessel ischemic changes and old lacunar infarcts. No mass effect or midline shift. No abnormal extra-axial fluid collections. Gray-white matter junctions are distinct. Basal cisterns are not effaced. No acute intracranial hemorrhage. Vascular: Moderate intracranial vascular calcifications. Skull: Calvarium appears intact. No depressed skull fractures. Sinuses/Orbits: There is complete opacification of the left maxillary antrum with increased density centrally. This could represent chronic sinusitis or perhaps fungal sinusitis. No acute air-fluid levels. Mastoid air cells are clear. Other: None. CT CERVICAL SPINE FINDINGS Alignment: Normal alignment of the cervical vertebrae and facet joints. C1-2 articulation appears intact. Skull base and vertebrae: Skull base appears intact. No vertebral compression deformities. No focal bone lesions or bone destruction. Soft tissues and spinal canal: No prevertebral soft tissue swelling. No abnormal paraspinal soft tissue mass or infiltration. Disc levels: Degenerative changes throughout the cervical spine with narrowed interspaces and prominent endplate hypertrophic changes throughout. Degenerative changes throughout the facet joints and at C1-2. Upper chest: Lung apices are clear. Vascular calcifications. Other: None. IMPRESSION: 1. No acute intracranial abnormalities. Chronic atrophy and small vessel ischemic changes. 2. Normal alignment of the cervical spine. Diffuse degenerative  changes. No acute displaced fractures identified. Electronically Signed   By: Lucienne Capers M.D.   On: 03/28/2018 06:53   Ct Cervical Spine Wo Contrast  Result Date: 03/28/2018 CLINICAL DATA:  Unwitnessed fall at the 9 time. Pain all over. Struck back of head. EXAM: CT HEAD WITHOUT CONTRAST CT CERVICAL SPINE WITHOUT CONTRAST TECHNIQUE: Multidetector CT imaging of the head and cervical spine was performed following the standard protocol without intravenous contrast. Multiplanar CT image reconstructions of the cervical spine were also generated. COMPARISON:  06/11/2017 FINDINGS: CT HEAD FINDINGS Brain: Diffuse cerebral atrophy. Ventricular dilatation consistent with central atrophy. Low-attenuation changes in the deep white matter consistent with small vessel ischemic changes and old lacunar infarcts. No mass effect or midline shift. No abnormal extra-axial fluid collections. Gray-white matter junctions are distinct. Basal cisterns are not effaced. No acute intracranial hemorrhage. Vascular: Moderate intracranial vascular calcifications. Skull: Calvarium appears intact. No depressed skull fractures. Sinuses/Orbits: There is complete opacification of the left maxillary antrum with increased density centrally. This could represent chronic sinusitis or perhaps fungal sinusitis. No acute air-fluid levels. Mastoid air cells are clear. Other: None. CT CERVICAL SPINE FINDINGS Alignment: Normal alignment of the cervical vertebrae and facet joints. C1-2 articulation appears intact. Skull base and vertebrae: Skull base appears intact. No vertebral compression deformities. No focal bone lesions or bone destruction. Soft tissues and spinal canal: No prevertebral soft tissue swelling. No abnormal paraspinal soft tissue mass or infiltration. Disc levels: Degenerative changes throughout the cervical spine with narrowed interspaces and prominent endplate hypertrophic changes throughout. Degenerative changes throughout the  facet joints and at C1-2. Upper chest: Lung apices are clear. Vascular calcifications. Other: None. IMPRESSION: 1. No acute intracranial abnormalities. Chronic atrophy and small vessel ischemic changes. 2. Normal alignment of the cervical spine. Diffuse degenerative changes. No acute displaced fractures identified. Electronically Signed   By: Lucienne Capers M.D.   On: 03/28/2018 06:53   Ct Abdomen Pelvis W Contrast  Result Date: 03/30/2018 CLINICAL DATA:  Abdominal pain EXAM: CT ABDOMEN AND PELVIS WITH CONTRAST TECHNIQUE: Multidetector CT imaging of the abdomen and pelvis was performed using the standard protocol following bolus administration of intravenous contrast. CONTRAST:  76mL ISOVUE-300 IOPAMIDOL (ISOVUE-300) INJECTION 61% COMPARISON:  None. FINDINGS: Lower chest: Mild dependent  atelectatic changes are noted. Hepatobiliary: No focal liver abnormality is seen. No gallstones, gallbladder wall thickening, or biliary dilatation. Pancreas: Unremarkable. No pancreatic ductal dilatation or surrounding inflammatory changes. Spleen: Normal in size without focal abnormality. Adrenals/Urinary Tract: Adrenal glands are within normal limits. Kidneys are well visualized with a normal enhancement pattern. Scattered small cysts are seen. No renal calculi or obstructive changes are seen. The bladder is partially distended. Stomach/Bowel: Stomach is within normal limits. Appendix appears normal. No evidence of bowel wall thickening, distention, or inflammatory changes. Vascular/Lymphatic: Aortic atherosclerosis. No enlarged abdominal or pelvic lymph nodes. Reproductive: Uterus and bilateral adnexa are unremarkable. Other: No abdominal wall hernia or abnormality. No abdominopelvic ascites. Musculoskeletal: Bilateral inferior and right superior pubic ramus fractures are noted. Associated right sacral fracture is seen superiorly. Degenerative changes of the lumbar spine are noted. IMPRESSION: Multiple pelvic fractures  consistent with the given clinical history. No acute abdominal abnormality is identified. Electronically Signed   By: Inez Catalina M.D.   On: 03/30/2018 14:33   Dg Humerus Right  Result Date: 03/28/2018 CLINICAL DATA:  Pain after a fall. EXAM: RIGHT HUMERUS - 2+ VIEW COMPARISON:  None. FINDINGS: Diffuse bone demineralization. Right humerus appears intact. No evidence of acute fracture or subluxation. No focal bone lesion or bone destruction. Bone cortex and trabecular architecture appear intact. Soft tissues are unremarkable. No radiopaque soft tissue foreign bodies. IMPRESSION: Diffuse bone demineralization. No acute bony abnormalities. Electronically Signed   By: Lucienne Capers M.D.   On: 03/28/2018 07:03   Dg Hip Unilat W Or Wo Pelvis 2-3 Views Right  Result Date: 03/28/2018 CLINICAL DATA:  Patient fell at home today. Right shoulder and arm pain, lower back pain, and bilateral hip pain. EXAM: DG HIP (WITH OR WITHOUT PELVIS) 2-3V RIGHT COMPARISON:  06/11/2017 FINDINGS: Diffuse bone demineralization. Visualization of the sacrum and upper pelvis is limited due to bowel gas and stool. Degenerative changes in both hips. Fractures of the right superior and inferior pubic ramus with minimal displacement. No evidence of acute fracture or dislocation of the right hip. Vascular calcifications. IMPRESSION: Acute posttraumatic fractures of the right superior and inferior pubic rami. Diffuse bone demineralization. Electronically Signed   By: Lucienne Capers M.D.   On: 03/28/2018 07:02    Assessment/Plan  #1- possible syncopal episode. Apparently she was working with therapy-she continues to be quite frail- suspect some of this may been that she just tried to do a bit too much in therapy-.  Clinically she appears to be at baseline on examination especially on repeat examination.  .  We did order an EKG which does not show anything acute-.  Will update her labs including a CBC metabolic panel and  TSH.  Also with previous history of weakness altered mental status that was diagnosed with pneumonia and UTI will obtain a chest x-ray and a urinalysis and culture-she does not complain a cough or shortness of breath however and does not really complain of dysuria either.  Continue to monitor closely with vital signs and neuro checks hourly x2 and then every 4 hours x2 and then every shift.  I did reevaluate her shortly after the episode and she appeared to be at her baseline resting in bed comfortably pain appeared to be improved.  I do note she does receive tramadol as needed but according to nursing she has tolerated this well she also receives Tylenol.   #2 history of hypertension appears her blood pressure has come down when she was in bed-at  this point continue to monitor-continues on Norvasc 7.5 mg a day.  3.  History of pelvic rami fracture- at this point has Tylenol and tramadol for pain-Ortho consult was counseled at daughter's request previously.  Will obtain x-rays of her back and pelvis as well.  4.-  History of superficial DVT in December 2018 Eliquis was discontinued in facility apparently she had not been taking this for 3 months prior to her admission to nursing facility.  This also has been discontinued because of history of recurrent falls.  5.  History of dementia-she did have a CT scan of her head in the hospital which was negative for any acute process- mental status now appears to be fairly at baseline she is alert talking follows simple verbal commands without difficulty . [id number] note greater than 40 minutes spent assessing patient-reassessing patient- discussing her status with nursing staff as well as with her daughter at bedside- reviewing her chart and labs-and coordinating a plan of care- of note greater than 50% of time spent coordinating plan of care with input as noted above

## 2018-04-16 ENCOUNTER — Non-Acute Institutional Stay (SKILLED_NURSING_FACILITY): Payer: Medicare Other | Admitting: Internal Medicine

## 2018-04-16 ENCOUNTER — Encounter (HOSPITAL_COMMUNITY)
Admission: RE | Admit: 2018-04-16 | Discharge: 2018-04-16 | Disposition: A | Discharge status: Home / Self Care | Payer: Medicare Other | Source: Skilled Nursing Facility | Attending: *Deleted | Admitting: *Deleted

## 2018-04-16 ENCOUNTER — Encounter: Payer: Self-pay | Admitting: Internal Medicine

## 2018-04-16 DIAGNOSIS — S32591D Other specified fracture of right pubis, subsequent encounter for fracture with routine healing: Secondary | ICD-10-CM | POA: Diagnosis not present

## 2018-04-16 DIAGNOSIS — R531 Weakness: Secondary | ICD-10-CM | POA: Diagnosis not present

## 2018-04-16 DIAGNOSIS — W19XXXS Unspecified fall, sequela: Secondary | ICD-10-CM

## 2018-04-16 DIAGNOSIS — I10 Essential (primary) hypertension: Secondary | ICD-10-CM | POA: Diagnosis not present

## 2018-04-16 LAB — BASIC METABOLIC PANEL
Anion gap: 10 (ref 5–15)
BUN: 22 mg/dL (ref 8–23)
CALCIUM: 9.7 mg/dL (ref 8.9–10.3)
CO2: 29 mmol/L (ref 22–32)
CREATININE: 0.77 mg/dL (ref 0.44–1.00)
Chloride: 102 mmol/L (ref 98–111)
GFR calc Af Amer: >60 mL/min (ref >60)
GFR calc non Af Amer: >60 mL/min (ref >60)
Glucose, Bld: 86 mg/dL (ref 70–99)
Potassium: 4.3 mmol/L (ref 3.5–5.1)
SODIUM: 141 mmol/L (ref 135–145)

## 2018-04-16 LAB — URINE CULTURE

## 2018-04-16 LAB — CBC
HCT: 34.2 % — ABNORMAL LOW (ref 36.0–46.0)
HEMOGLOBIN: 10.3 g/dL — AB (ref 12.0–15.0)
MCH: 30 pg (ref 26.0–34.0)
MCHC: 30.1 g/dL (ref 30.0–36.0)
MCV: 99.7 fL (ref 80.0–100.0)
PLATELETS: 416 10*3/uL — AB (ref 150–400)
RBC: 3.43 MIL/uL — AB (ref 3.87–5.11)
RDW: 13.6 % (ref 11.5–15.5)
WBC: 5.9 10*3/uL (ref 4.0–10.5)
nRBC: 0 % (ref 0.0–0.2)

## 2018-04-16 NOTE — Progress Notes (Signed)
Location:    [facility] Room Number: 140/D Place of Service:  SNF (31)  Provider: Veleta Miners MD  PCP: [name], MD Patient Care Team: [name], MD as PCP - [facility])  Extended Emergency Contact Information Primary Emergency Contact: Fargis,Brenda Address: [address] [place] [address] [place] Phone: [phone] Relation: Daughter  Code Status: DNR Goals of care:  Advanced Directive information Advanced Directives 04/16/2018  Does Patient Have a Medical Advance Directive? Yes  Type of Advance Directive Out of facility DNR (pink MOST or yellow form)  Does patient want to make changes to medical advance directive? No - Patient declined  Would patient like information on creating a medical advance directive? -  Pre-existing out of facility DNR order (yellow form or pink MOST form) -     Allergies  Allergen Reactions  . Aspirin Nausea Only  . Neosporin [Neomycin-Bacitracin Zn-Polymyx] Rash    Chief Complaint  Patient presents with  . Discharge Note    Discharge Visit    HPI:  84 y.o. female seen today for discharge after staying in the SNF for therapy after Fall and sustaining Pubic Rami Fracture at home.  She has h/o Hypertension and Superficial DVT Diagnosed in 12/18 Patient was brought to ED after fall at home. She fell while getting in the Bed. It seems she was on the floor for few hours before her Yolanda Bonine found her. She was found to have Closed Fracture of Right Pubis both Superior and Inferior Rami. She was unable to put any weight on her legs. She was send to SNF for Therapy . In Facilitypatient was very confused and got Lethargic and was send back to ED where she was diagnosed with Pneumonia and UTI. She was started on IV rocephin and Zithromax. She was eventually switched to PO Augmentin. Patient has done well since then in the facility. She did have some ? Syncope  few days ago in therapy. But her Daughter did not want anything aggressive. Her Blood work and EKG done in the facility did not show any acute process She is working with the Therapy and is now able to stand up by herself. She is able to do Transfers with Mild assist. Her daughter is planning to take her home with 24 hour Supervision.    Past Medical History:  Diagnosis Date  . Anxiety   . Headache   . Hypertension     Past Surgical History:  Procedure Laterality Date  . EYE SURGERY    . SKIN BIOPSY    . skin cancer removal        reports that she has never smoked. She has never used smokeless tobacco. She reports that she does not drink alcohol or use drugs. Social History   Socioeconomic History  . Marital status: Widowed    Spouse name: Not on file  . Number of children: Not on file  . Years of education: Not on file  . Highest education level: Not on file  Occupational History  . Not on file  Social Needs  . Financial resource strain: Not on file  . Food insecurity:    Worry: Not on file    Inability: Not on file  . Transportation needs:    Medical: Not on file    Non-medical: Not on file  Tobacco Use  . Smoking status: Never Smoker  . Smokeless tobacco: Never  Used  Substance and Sexual Activity  . Alcohol use: No  . Drug use: No  . Sexual activity: Not on file  Lifestyle  . Physical activity:    Days per week: Not on file    Minutes per session: Not on file  . Stress: Not on file  Relationships  . Social connections:    Talks on phone: Not on file    Gets together: Not on file    Attends religious service: Not on file    Active member of club or organization: Not on file    Attends meetings of clubs or organizations: Not on file    Relationship status: Not on file  . Intimate partner violence:    Fear of current or ex partner: Not on file    Emotionally abused: Not on file    Physically abused: Not on file    Forced sexual activity: Not on file    Other Topics Concern  . Not on file  Social History Narrative  . Not on file   Functional Status Survey:    Allergies  Allergen Reactions  . Aspirin Nausea Only  . Neosporin [Neomycin-Bacitracin Zn-Polymyx] Rash    Pertinent  Health Maintenance Due  Topic Date Due  . INFLUENZA VACCINE  04/30/2018 (Originally 01/18/2018)  . DEXA SCAN  04/30/2018 (Originally 09/01/1990)  . PNA vac Low Risk Adult (1 of 2 - PCV13) 04/30/2018 (Originally 09[birth year]01/1990)    Medications: Outpatient Encounter Medications as of 04/16/2018  Medication Sig  . acetaminophen (TYLENOL) 500 MG tablet Take 1,000 mg by mouth every 8 (eight) hours as needed.   . ALPRAZolam (XANAX) 0.25 MG tablet Take 0.25 mg by mouth 2 (two) times daily as needed for anxiety.  Marland Kitchen amLODipine (NORVASC) 2.5 MG tablet Take 2.5 mg by mouth daily. Give along with 5 mg to= 7.5 mg  . amLODipine (NORVASC) 5 MG tablet Take 5 mg by mouth daily. Along with 2.5 mg to = 7.5  . carbamide peroxide (DEBROX) 6.5 % OTIC solution Place 5 drops into both ears daily as needed.  . Carboxymethylcellul-Glycerin (CLEAR EYES FOR DRY EYES) 1-0.25 % SOLN Place 1-2 drops into both eyes daily as needed.  . polyethylene glycol (MIRALAX / GLYCOLAX) packet Take 17 g by mouth daily as needed.  . senna-docusate (SENOKOT-S) 8.6-50 MG tablet Take 1 tablet by mouth 2 (two) times daily.  . shark liver oil-cocoa butter (PREPARATION H) 0.25-88.44 % suppository Place 1 suppository rectally as needed for hemorrhoids.  . traMADol (ULTRAM) 50 MG tablet Take 1 tablet (50 mg total) by mouth 2 (two) times daily as needed.   No facility-administered encounter medications on file as of 04/16/2018.      Review of Systems  Vitals:   04/16/18 1214  BP: (!) 132/55  Pulse: 90  Resp: 17  Temp: (!) 96.5 F (35.8 C)  SpO2: 97%   There is no height or weight on file to calculate BMI. Physical Exam  Constitutional: She appears well-developed.  Very Frail  HENT:  Head:  Normocephalic.  Dry Mouth with Thrush  Eyes: Pupils are equal, round, and reactive to light.  Neck: Neck supple.  Cardiovascular: Normal rate and regular rhythm.  No murmur heard. Pulmonary/Chest: Effort normal and breath sounds normal. No stridor. No respiratory distress. She has no wheezes.  Abdominal: Soft. Bowel sounds are normal. She exhibits no distension. There is no tenderness. There is no guarding.  Musculoskeletal: She exhibits no edema.  Neurological: She is alert.  Alert  and Follows Cmmands  Skin: Skin is warm and dry.  Psychiatric: She has a normal mood and affect. Her behavior is normal. Thought content normal.    Labs reviewed: Basic Metabolic Panel: Recent Labs    04/02/18 0300 04/13/18 1500 04/16/18 0318  NA 142 139 141  K 3.8 4.5 4.3  CL 108 102 102  CO2 29 30 29   GLUCOSE 82 132* 86  BUN 23 25* 22  CREATININE 0.73 0.81 0.77  CALCIUM 9.2 9.5 9.7   Liver Function Tests: Recent Labs    06/11/17 1050 03/28/18 0631 04/13/18 1500  AST 24 24 21   ALT 14 19 16   ALKPHOS 69 72 281*  BILITOT 0.6 0.7 0.5  PROT 7.0 7.5 7.0  ALBUMIN 3.7 4.0 3.3*   No results for input(s): LIPASE, AMYLASE in the last 8760 hours. No results for input(s): AMMONIA in the last 8760 hours. CBC: Recent Labs    03/28/18 0631 03/30/18 1301  04/03/18 0700 04/13/18 1500 04/16/18 0318  WBC 10.2 9.9   < > 6.7 11.5* 5.9  NEUTROABS 8.6* 8.8*  --   --  10.3*  --   HGB 12.4 17.3*   < > 10.9* 10.3* 10.3*  HCT 40.6 55.9*   < > 36.7 34.7* 34.2*  MCV 100.0 97.2   < > 98.9 99.1 99.7  PLT 226 193   < > 252 475* 416*   < > = values in this interval not displayed.   Cardiac Enzymes: Recent Labs    06/11/17 1050 03/28/18 0631  CKTOTAL 256* 67  TROPONINI 0.03*  --    BNP: Invalid input(s): POCBNP CBG: Recent Labs    03/28/18 0955  GLUCAP 119*    Procedures and Imaging Studies During Stay: Dg Chest 1 View  Result Date: 03/30/2018 CLINICAL DATA:  Altered mental status. EXAM:  CHEST  1 VIEW COMPARISON:  Chest x-ray dated June 11, 2017. FINDINGS: Stable cardiomegaly. Atherosclerotic calcification of the aortic arch. The lungs remain hyperinflated with chronic bronchitic changes. New patchy airspace disease in the right greater than left upper lobes. No pleural effusion or pneumothorax. No acute osseous abnormality. IMPRESSION: 1. New patchy airspace disease in the bilateral upper lobes could reflect pneumonia or edema. Electronically Signed   By: Titus Dubin M.D.   On: 03/30/2018 13:23   Dg Shoulder Right  Result Date: 03/28/2018 CLINICAL DATA:  Pain after a fall. EXAM: RIGHT SHOULDER - 2+ VIEW COMPARISON:  None. FINDINGS: Diffuse bone demineralization. Degenerative changes in the right shoulder involving the glenohumeral joint. No evidence of acute fracture or subluxation. No focal bone lesion or bone destruction. Bone cortex and trabecular architecture appear intact. No radiopaque soft tissue foreign bodies. IMPRESSION: Degenerative changes in the right shoulder. No acute bony abnormalities. Electronically Signed   By: Lucienne Capers M.D.   On: 03/28/2018 07:03   Ct Head Wo Contrast  Result Date: 03/28/2018 CLINICAL DATA:  Unwitnessed fall at the 9 time. Pain all over. Struck back of head. EXAM: CT HEAD WITHOUT CONTRAST CT CERVICAL SPINE WITHOUT CONTRAST TECHNIQUE: Multidetector CT imaging of the head and cervical spine was performed following the standard protocol without intravenous contrast. Multiplanar CT image reconstructions of the cervical spine were also generated. COMPARISON:  06/11/2017 FINDINGS: CT HEAD FINDINGS Brain: Diffuse cerebral atrophy. Ventricular dilatation consistent with central atrophy. Low-attenuation changes in the deep white matter consistent with small vessel ischemic changes and old lacunar infarcts. No mass effect or midline shift. No abnormal extra-axial fluid collections. Gray-white matter  junctions are distinct. Basal cisterns are not  effaced. No acute intracranial hemorrhage. Vascular: Moderate intracranial vascular calcifications. Skull: Calvarium appears intact. No depressed skull fractures. Sinuses/Orbits: There is complete opacification of the left maxillary antrum with increased density centrally. This could represent chronic sinusitis or perhaps fungal sinusitis. No acute air-fluid levels. Mastoid air cells are clear. Other: None. CT CERVICAL SPINE FINDINGS Alignment: Normal alignment of the cervical vertebrae and facet joints. C1-2 articulation appears intact. Skull base and vertebrae: Skull base appears intact. No vertebral compression deformities. No focal bone lesions or bone destruction. Soft tissues and spinal canal: No prevertebral soft tissue swelling. No abnormal paraspinal soft tissue mass or infiltration. Disc levels: Degenerative changes throughout the cervical spine with narrowed interspaces and prominent endplate hypertrophic changes throughout. Degenerative changes throughout the facet joints and at C1-2. Upper chest: Lung apices are clear. Vascular calcifications. Other: None. IMPRESSION: 1. No acute intracranial abnormalities. Chronic atrophy and small vessel ischemic changes. 2. Normal alignment of the cervical spine. Diffuse degenerative changes. No acute displaced fractures identified. Electronically Signed   By: Lucienne Capers M.D.   On: 03/28/2018 06:53   Ct Cervical Spine Wo Contrast  Result Date: 03/28/2018 CLINICAL DATA:  Unwitnessed fall at the 9 time. Pain all over. Struck back of head. EXAM: CT HEAD WITHOUT CONTRAST CT CERVICAL SPINE WITHOUT CONTRAST TECHNIQUE: Multidetector CT imaging of the head and cervical spine was performed following the standard protocol without intravenous contrast. Multiplanar CT image reconstructions of the cervical spine were also generated. COMPARISON:  06/11/2017 FINDINGS: CT HEAD FINDINGS Brain: Diffuse cerebral atrophy. Ventricular dilatation consistent with central atrophy.  Low-attenuation changes in the deep white matter consistent with small vessel ischemic changes and old lacunar infarcts. No mass effect or midline shift. No abnormal extra-axial fluid collections. Gray-white matter junctions are distinct. Basal cisterns are not effaced. No acute intracranial hemorrhage. Vascular: Moderate intracranial vascular calcifications. Skull: Calvarium appears intact. No depressed skull fractures. Sinuses/Orbits: There is complete opacification of the left maxillary antrum with increased density centrally. This could represent chronic sinusitis or perhaps fungal sinusitis. No acute air-fluid levels. Mastoid air cells are clear. Other: None. CT CERVICAL SPINE FINDINGS Alignment: Normal alignment of the cervical vertebrae and facet joints. C1-2 articulation appears intact. Skull base and vertebrae: Skull base appears intact. No vertebral compression deformities. No focal bone lesions or bone destruction. Soft tissues and spinal canal: No prevertebral soft tissue swelling. No abnormal paraspinal soft tissue mass or infiltration. Disc levels: Degenerative changes throughout the cervical spine with narrowed interspaces and prominent endplate hypertrophic changes throughout. Degenerative changes throughout the facet joints and at C1-2. Upper chest: Lung apices are clear. Vascular calcifications. Other: None. IMPRESSION: 1. No acute intracranial abnormalities. Chronic atrophy and small vessel ischemic changes. 2. Normal alignment of the cervical spine. Diffuse degenerative changes. No acute displaced fractures identified. Electronically Signed   By: Lucienne Capers M.D.   On: 03/28/2018 06:53   Ct Abdomen Pelvis W Contrast  Result Date: 03/30/2018 CLINICAL DATA:  Abdominal pain EXAM: CT ABDOMEN AND PELVIS WITH CONTRAST TECHNIQUE: Multidetector CT imaging of the abdomen and pelvis was performed using the standard protocol following bolus administration of intravenous contrast. CONTRAST:  63mL  ISOVUE-300 IOPAMIDOL (ISOVUE-300) INJECTION 61% COMPARISON:  None. FINDINGS: Lower chest: Mild dependent atelectatic changes are noted. Hepatobiliary: No focal liver abnormality is seen. No gallstones, gallbladder wall thickening, or biliary dilatation. Pancreas: Unremarkable. No pancreatic ductal dilatation or surrounding inflammatory changes. Spleen: Normal in size without focal abnormality. Adrenals/Urinary Tract: Adrenal  glands are within normal limits. Kidneys are well visualized with a normal enhancement pattern. Scattered small cysts are seen. No renal calculi or obstructive changes are seen. The bladder is partially distended. Stomach/Bowel: Stomach is within normal limits. Appendix appears normal. No evidence of bowel wall thickening, distention, or inflammatory changes. Vascular/Lymphatic: Aortic atherosclerosis. No enlarged abdominal or pelvic lymph nodes. Reproductive: Uterus and bilateral adnexa are unremarkable. Other: No abdominal wall hernia or abnormality. No abdominopelvic ascites. Musculoskeletal: Bilateral inferior and right superior pubic ramus fractures are noted. Associated right sacral fracture is seen superiorly. Degenerative changes of the lumbar spine are noted. IMPRESSION: Multiple pelvic fractures consistent with the given clinical history. No acute abdominal abnormality is identified. Electronically Signed   By: Inez Catalina M.D.   On: 03/30/2018 14:33   Dg Humerus Right  Result Date: 03/28/2018 CLINICAL DATA:  Pain after a fall. EXAM: RIGHT HUMERUS - 2+ VIEW COMPARISON:  None. FINDINGS: Diffuse bone demineralization. Right humerus appears intact. No evidence of acute fracture or subluxation. No focal bone lesion or bone destruction. Bone cortex and trabecular architecture appear intact. Soft tissues are unremarkable. No radiopaque soft tissue foreign bodies. IMPRESSION: Diffuse bone demineralization. No acute bony abnormalities. Electronically Signed   By: Lucienne Capers M.D.    On: 03/28/2018 07:03   Dg Hip Unilat W Or Wo Pelvis 2-3 Views Right  Result Date: 03/28/2018 CLINICAL DATA:  Patient fell at home today. Right shoulder and arm pain, lower back pain, and bilateral hip pain. EXAM: DG HIP (WITH OR WITHOUT PELVIS) 2-3V RIGHT COMPARISON:  06/11/2017 FINDINGS: Diffuse bone demineralization. Visualization of the sacrum and upper pelvis is limited due to bowel gas and stool. Degenerative changes in both hips. Fractures of the right superior and inferior pubic ramus with minimal displacement. No evidence of acute fracture or dislocation of the right hip. Vascular calcifications. IMPRESSION: Acute posttraumatic fractures of the right superior and inferior pubic rami. Diffuse bone demineralization. Electronically Signed   By: Lucienne Capers M.D.   On: 03/28/2018 07:02    Assessment/Plan:   Pneumonia She finished course of Augmentin Afebrile with no Leucocytisis UTI Treated with Augmentin. Urine Culture was repeated which did not have enough Growth to be treated  S/P Pelvic Rami Fracture Tylenol and Ultram for pain Ortho consult was cancelled due to Daughters request Patient is now able to put Weight and able to transfers with Gilford Rile. She was given Wheelchair for home. Hypertension BP better controlled on Amlodipine  Dose was increased  Explained daughter that she does need something for her BP Anxiety On Low dose of Xanax PRN ? Superficial DVT in 12/18 Per daughter Patient did take her Eliquis for 3 months. Wasdiscontinued in SNF   especially since her daughter says she has h/o Recurrent Falls. Anemia Hgb Stable Mouth Thrush Will start her on Nystatin  Dementiawith ACP. Patient had CT scanof headwhich was negative or any acute process. Daughter does understand that patient will need more Supervision at home. Daughter had signed MOST form for Facility for [facility] for her Mom. Patient would be followed by Home health and Home therapy She  will also follow with her PCP       Future labs/tests needed:    Discharge Time More then 30 min

## 2018-04-18 DIAGNOSIS — Z79899 Other long term (current) drug therapy: Secondary | ICD-10-CM | POA: Diagnosis not present

## 2018-04-18 DIAGNOSIS — F419 Anxiety disorder, unspecified: Secondary | ICD-10-CM | POA: Diagnosis not present

## 2018-04-18 DIAGNOSIS — N390 Urinary tract infection, site not specified: Secondary | ICD-10-CM | POA: Diagnosis not present

## 2018-04-18 DIAGNOSIS — J189 Pneumonia, unspecified organism: Secondary | ICD-10-CM | POA: Diagnosis not present

## 2018-04-18 DIAGNOSIS — I10 Essential (primary) hypertension: Secondary | ICD-10-CM | POA: Diagnosis not present

## 2018-04-18 DIAGNOSIS — Z86718 Personal history of other venous thrombosis and embolism: Secondary | ICD-10-CM | POA: Diagnosis not present

## 2018-04-18 DIAGNOSIS — S32511D Fracture of superior rim of right pubis, subsequent encounter for fracture with routine healing: Secondary | ICD-10-CM | POA: Diagnosis not present

## 2018-04-18 DIAGNOSIS — D649 Anemia, unspecified: Secondary | ICD-10-CM | POA: Diagnosis not present

## 2018-04-18 DIAGNOSIS — Z79891 Long term (current) use of opiate analgesic: Secondary | ICD-10-CM | POA: Diagnosis not present

## 2018-04-18 DIAGNOSIS — F0390 Unspecified dementia without behavioral disturbance: Secondary | ICD-10-CM | POA: Diagnosis not present

## 2018-04-18 DIAGNOSIS — M19011 Primary osteoarthritis, right shoulder: Secondary | ICD-10-CM | POA: Diagnosis not present

## 2018-04-18 DIAGNOSIS — M160 Bilateral primary osteoarthritis of hip: Secondary | ICD-10-CM | POA: Diagnosis not present

## 2018-04-18 DIAGNOSIS — S32591D Other specified fracture of right pubis, subsequent encounter for fracture with routine healing: Secondary | ICD-10-CM | POA: Diagnosis not present

## 2018-04-20 DIAGNOSIS — N390 Urinary tract infection, site not specified: Secondary | ICD-10-CM | POA: Diagnosis not present

## 2018-04-20 DIAGNOSIS — J189 Pneumonia, unspecified organism: Secondary | ICD-10-CM | POA: Diagnosis not present

## 2018-04-20 DIAGNOSIS — S32511D Fracture of superior rim of right pubis, subsequent encounter for fracture with routine healing: Secondary | ICD-10-CM | POA: Diagnosis not present

## 2018-04-20 DIAGNOSIS — F0390 Unspecified dementia without behavioral disturbance: Secondary | ICD-10-CM | POA: Diagnosis not present

## 2018-04-20 DIAGNOSIS — I10 Essential (primary) hypertension: Secondary | ICD-10-CM | POA: Diagnosis not present

## 2018-04-20 DIAGNOSIS — S32591D Other specified fracture of right pubis, subsequent encounter for fracture with routine healing: Secondary | ICD-10-CM | POA: Diagnosis not present

## 2018-04-24 DIAGNOSIS — F0390 Unspecified dementia without behavioral disturbance: Secondary | ICD-10-CM | POA: Diagnosis not present

## 2018-04-24 DIAGNOSIS — S32511D Fracture of superior rim of right pubis, subsequent encounter for fracture with routine healing: Secondary | ICD-10-CM | POA: Diagnosis not present

## 2018-04-24 DIAGNOSIS — N390 Urinary tract infection, site not specified: Secondary | ICD-10-CM | POA: Diagnosis not present

## 2018-04-24 DIAGNOSIS — J189 Pneumonia, unspecified organism: Secondary | ICD-10-CM | POA: Diagnosis not present

## 2018-04-24 DIAGNOSIS — S32591D Other specified fracture of right pubis, subsequent encounter for fracture with routine healing: Secondary | ICD-10-CM | POA: Diagnosis not present

## 2018-04-24 DIAGNOSIS — I10 Essential (primary) hypertension: Secondary | ICD-10-CM | POA: Diagnosis not present

## 2018-04-26 DIAGNOSIS — J189 Pneumonia, unspecified organism: Secondary | ICD-10-CM | POA: Diagnosis not present

## 2018-04-26 DIAGNOSIS — I10 Essential (primary) hypertension: Secondary | ICD-10-CM | POA: Diagnosis not present

## 2018-04-26 DIAGNOSIS — S32591D Other specified fracture of right pubis, subsequent encounter for fracture with routine healing: Secondary | ICD-10-CM | POA: Diagnosis not present

## 2018-04-26 DIAGNOSIS — S32511D Fracture of superior rim of right pubis, subsequent encounter for fracture with routine healing: Secondary | ICD-10-CM | POA: Diagnosis not present

## 2018-04-26 DIAGNOSIS — N390 Urinary tract infection, site not specified: Secondary | ICD-10-CM | POA: Diagnosis not present

## 2018-04-26 DIAGNOSIS — F0390 Unspecified dementia without behavioral disturbance: Secondary | ICD-10-CM | POA: Diagnosis not present

## 2018-04-30 DIAGNOSIS — N390 Urinary tract infection, site not specified: Secondary | ICD-10-CM | POA: Diagnosis not present

## 2018-04-30 DIAGNOSIS — S32511D Fracture of superior rim of right pubis, subsequent encounter for fracture with routine healing: Secondary | ICD-10-CM | POA: Diagnosis not present

## 2018-04-30 DIAGNOSIS — F0390 Unspecified dementia without behavioral disturbance: Secondary | ICD-10-CM | POA: Diagnosis not present

## 2018-04-30 DIAGNOSIS — S32591D Other specified fracture of right pubis, subsequent encounter for fracture with routine healing: Secondary | ICD-10-CM | POA: Diagnosis not present

## 2018-04-30 DIAGNOSIS — J189 Pneumonia, unspecified organism: Secondary | ICD-10-CM | POA: Diagnosis not present

## 2018-04-30 DIAGNOSIS — I10 Essential (primary) hypertension: Secondary | ICD-10-CM | POA: Diagnosis not present

## 2018-05-02 DIAGNOSIS — S32511D Fracture of superior rim of right pubis, subsequent encounter for fracture with routine healing: Secondary | ICD-10-CM | POA: Diagnosis not present

## 2018-05-02 DIAGNOSIS — N390 Urinary tract infection, site not specified: Secondary | ICD-10-CM | POA: Diagnosis not present

## 2018-05-02 DIAGNOSIS — J189 Pneumonia, unspecified organism: Secondary | ICD-10-CM | POA: Diagnosis not present

## 2018-05-02 DIAGNOSIS — I10 Essential (primary) hypertension: Secondary | ICD-10-CM | POA: Diagnosis not present

## 2018-05-02 DIAGNOSIS — F0390 Unspecified dementia without behavioral disturbance: Secondary | ICD-10-CM | POA: Diagnosis not present

## 2018-05-02 DIAGNOSIS — S32591D Other specified fracture of right pubis, subsequent encounter for fracture with routine healing: Secondary | ICD-10-CM | POA: Diagnosis not present

## 2018-05-04 DIAGNOSIS — N390 Urinary tract infection, site not specified: Secondary | ICD-10-CM | POA: Diagnosis not present

## 2018-05-04 DIAGNOSIS — S32511D Fracture of superior rim of right pubis, subsequent encounter for fracture with routine healing: Secondary | ICD-10-CM | POA: Diagnosis not present

## 2018-05-04 DIAGNOSIS — F0390 Unspecified dementia without behavioral disturbance: Secondary | ICD-10-CM | POA: Diagnosis not present

## 2018-05-04 DIAGNOSIS — I10 Essential (primary) hypertension: Secondary | ICD-10-CM | POA: Diagnosis not present

## 2018-05-04 DIAGNOSIS — S32591D Other specified fracture of right pubis, subsequent encounter for fracture with routine healing: Secondary | ICD-10-CM | POA: Diagnosis not present

## 2018-05-04 DIAGNOSIS — J189 Pneumonia, unspecified organism: Secondary | ICD-10-CM | POA: Diagnosis not present

## 2018-05-07 DIAGNOSIS — S32591D Other specified fracture of right pubis, subsequent encounter for fracture with routine healing: Secondary | ICD-10-CM | POA: Diagnosis not present

## 2018-05-07 DIAGNOSIS — N390 Urinary tract infection, site not specified: Secondary | ICD-10-CM | POA: Diagnosis not present

## 2018-05-07 DIAGNOSIS — I10 Essential (primary) hypertension: Secondary | ICD-10-CM | POA: Diagnosis not present

## 2018-05-07 DIAGNOSIS — J189 Pneumonia, unspecified organism: Secondary | ICD-10-CM | POA: Diagnosis not present

## 2018-05-07 DIAGNOSIS — S32511D Fracture of superior rim of right pubis, subsequent encounter for fracture with routine healing: Secondary | ICD-10-CM | POA: Diagnosis not present

## 2018-05-07 DIAGNOSIS — F0390 Unspecified dementia without behavioral disturbance: Secondary | ICD-10-CM | POA: Diagnosis not present

## 2018-05-10 DIAGNOSIS — J189 Pneumonia, unspecified organism: Secondary | ICD-10-CM | POA: Diagnosis not present

## 2018-05-10 DIAGNOSIS — E559 Vitamin D deficiency, unspecified: Secondary | ICD-10-CM | POA: Diagnosis not present

## 2018-05-10 DIAGNOSIS — C44319 Basal cell carcinoma of skin of other parts of face: Secondary | ICD-10-CM | POA: Diagnosis not present

## 2018-05-10 DIAGNOSIS — S329XXA Fracture of unspecified parts of lumbosacral spine and pelvis, initial encounter for closed fracture: Secondary | ICD-10-CM | POA: Diagnosis not present

## 2018-05-10 DIAGNOSIS — I10 Essential (primary) hypertension: Secondary | ICD-10-CM | POA: Diagnosis not present

## 2018-05-10 DIAGNOSIS — F419 Anxiety disorder, unspecified: Secondary | ICD-10-CM | POA: Diagnosis not present

## 2018-05-10 DIAGNOSIS — D511 Vitamin B12 deficiency anemia due to selective vitamin B12 malabsorption with proteinuria: Secondary | ICD-10-CM | POA: Diagnosis not present

## 2018-05-10 DIAGNOSIS — Z681 Body mass index (BMI) 19 or less, adult: Secondary | ICD-10-CM | POA: Diagnosis not present

## 2018-05-15 DIAGNOSIS — I10 Essential (primary) hypertension: Secondary | ICD-10-CM | POA: Diagnosis not present

## 2018-05-15 DIAGNOSIS — S32511D Fracture of superior rim of right pubis, subsequent encounter for fracture with routine healing: Secondary | ICD-10-CM | POA: Diagnosis not present

## 2018-05-15 DIAGNOSIS — F0390 Unspecified dementia without behavioral disturbance: Secondary | ICD-10-CM | POA: Diagnosis not present

## 2018-05-15 DIAGNOSIS — N390 Urinary tract infection, site not specified: Secondary | ICD-10-CM | POA: Diagnosis not present

## 2018-05-15 DIAGNOSIS — S32591D Other specified fracture of right pubis, subsequent encounter for fracture with routine healing: Secondary | ICD-10-CM | POA: Diagnosis not present

## 2018-05-15 DIAGNOSIS — J189 Pneumonia, unspecified organism: Secondary | ICD-10-CM | POA: Diagnosis not present

## 2018-05-16 DIAGNOSIS — M25562 Pain in left knee: Secondary | ICD-10-CM | POA: Diagnosis not present

## 2018-05-16 DIAGNOSIS — M25552 Pain in left hip: Secondary | ICD-10-CM | POA: Diagnosis not present

## 2018-05-21 DIAGNOSIS — F0390 Unspecified dementia without behavioral disturbance: Secondary | ICD-10-CM | POA: Diagnosis not present

## 2018-05-21 DIAGNOSIS — J189 Pneumonia, unspecified organism: Secondary | ICD-10-CM | POA: Diagnosis not present

## 2018-05-21 DIAGNOSIS — S32511D Fracture of superior rim of right pubis, subsequent encounter for fracture with routine healing: Secondary | ICD-10-CM | POA: Diagnosis not present

## 2018-05-21 DIAGNOSIS — S32591D Other specified fracture of right pubis, subsequent encounter for fracture with routine healing: Secondary | ICD-10-CM | POA: Diagnosis not present

## 2018-05-21 DIAGNOSIS — I10 Essential (primary) hypertension: Secondary | ICD-10-CM | POA: Diagnosis not present

## 2018-05-21 DIAGNOSIS — N390 Urinary tract infection, site not specified: Secondary | ICD-10-CM | POA: Diagnosis not present

## 2018-05-29 DIAGNOSIS — I10 Essential (primary) hypertension: Secondary | ICD-10-CM | POA: Diagnosis not present

## 2018-05-29 DIAGNOSIS — J189 Pneumonia, unspecified organism: Secondary | ICD-10-CM | POA: Diagnosis not present

## 2018-05-29 DIAGNOSIS — S32511D Fracture of superior rim of right pubis, subsequent encounter for fracture with routine healing: Secondary | ICD-10-CM | POA: Diagnosis not present

## 2018-05-29 DIAGNOSIS — S32591D Other specified fracture of right pubis, subsequent encounter for fracture with routine healing: Secondary | ICD-10-CM | POA: Diagnosis not present

## 2018-05-29 DIAGNOSIS — F0390 Unspecified dementia without behavioral disturbance: Secondary | ICD-10-CM | POA: Diagnosis not present

## 2018-05-29 DIAGNOSIS — N390 Urinary tract infection, site not specified: Secondary | ICD-10-CM | POA: Diagnosis not present

## 2018-06-04 DIAGNOSIS — C44310 Basal cell carcinoma of skin of unspecified parts of face: Secondary | ICD-10-CM | POA: Diagnosis not present

## 2018-06-04 DIAGNOSIS — Z681 Body mass index (BMI) 19 or less, adult: Secondary | ICD-10-CM | POA: Diagnosis not present

## 2018-06-05 DIAGNOSIS — S32511D Fracture of superior rim of right pubis, subsequent encounter for fracture with routine healing: Secondary | ICD-10-CM | POA: Diagnosis not present

## 2018-06-05 DIAGNOSIS — N390 Urinary tract infection, site not specified: Secondary | ICD-10-CM | POA: Diagnosis not present

## 2018-06-05 DIAGNOSIS — J189 Pneumonia, unspecified organism: Secondary | ICD-10-CM | POA: Diagnosis not present

## 2018-06-05 DIAGNOSIS — I10 Essential (primary) hypertension: Secondary | ICD-10-CM | POA: Diagnosis not present

## 2018-06-05 DIAGNOSIS — F0390 Unspecified dementia without behavioral disturbance: Secondary | ICD-10-CM | POA: Diagnosis not present

## 2018-06-05 DIAGNOSIS — S32591D Other specified fracture of right pubis, subsequent encounter for fracture with routine healing: Secondary | ICD-10-CM | POA: Diagnosis not present

## 2018-06-15 DIAGNOSIS — N390 Urinary tract infection, site not specified: Secondary | ICD-10-CM | POA: Diagnosis not present

## 2018-06-15 DIAGNOSIS — F0390 Unspecified dementia without behavioral disturbance: Secondary | ICD-10-CM | POA: Diagnosis not present

## 2018-06-15 DIAGNOSIS — J189 Pneumonia, unspecified organism: Secondary | ICD-10-CM | POA: Diagnosis not present

## 2018-06-15 DIAGNOSIS — S32511D Fracture of superior rim of right pubis, subsequent encounter for fracture with routine healing: Secondary | ICD-10-CM | POA: Diagnosis not present

## 2018-06-15 DIAGNOSIS — I10 Essential (primary) hypertension: Secondary | ICD-10-CM | POA: Diagnosis not present

## 2018-06-15 DIAGNOSIS — S32591D Other specified fracture of right pubis, subsequent encounter for fracture with routine healing: Secondary | ICD-10-CM | POA: Diagnosis not present

## 2018-07-12 DIAGNOSIS — R531 Weakness: Secondary | ICD-10-CM | POA: Diagnosis not present

## 2018-07-12 DIAGNOSIS — I10 Essential (primary) hypertension: Secondary | ICD-10-CM | POA: Diagnosis not present

## 2018-07-12 DIAGNOSIS — R52 Pain, unspecified: Secondary | ICD-10-CM | POA: Diagnosis not present

## 2018-07-12 DIAGNOSIS — R55 Syncope and collapse: Secondary | ICD-10-CM | POA: Diagnosis not present

## 2018-07-12 DIAGNOSIS — R64 Cachexia: Secondary | ICD-10-CM | POA: Diagnosis not present

## 2018-07-13 DIAGNOSIS — I10 Essential (primary) hypertension: Secondary | ICD-10-CM | POA: Diagnosis not present

## 2018-07-13 DIAGNOSIS — R52 Pain, unspecified: Secondary | ICD-10-CM | POA: Diagnosis not present

## 2018-07-13 DIAGNOSIS — R55 Syncope and collapse: Secondary | ICD-10-CM | POA: Diagnosis not present

## 2018-07-13 DIAGNOSIS — R64 Cachexia: Secondary | ICD-10-CM | POA: Diagnosis not present

## 2018-07-13 DIAGNOSIS — R531 Weakness: Secondary | ICD-10-CM | POA: Diagnosis not present

## 2018-07-17 DIAGNOSIS — R531 Weakness: Secondary | ICD-10-CM | POA: Diagnosis not present

## 2018-07-17 DIAGNOSIS — I10 Essential (primary) hypertension: Secondary | ICD-10-CM | POA: Diagnosis not present

## 2018-07-17 DIAGNOSIS — R55 Syncope and collapse: Secondary | ICD-10-CM | POA: Diagnosis not present

## 2018-07-17 DIAGNOSIS — R64 Cachexia: Secondary | ICD-10-CM | POA: Diagnosis not present

## 2018-07-17 DIAGNOSIS — R52 Pain, unspecified: Secondary | ICD-10-CM | POA: Diagnosis not present

## 2018-07-21 DIAGNOSIS — I10 Essential (primary) hypertension: Secondary | ICD-10-CM | POA: Diagnosis not present

## 2018-07-21 DIAGNOSIS — R52 Pain, unspecified: Secondary | ICD-10-CM | POA: Diagnosis not present

## 2018-07-21 DIAGNOSIS — R531 Weakness: Secondary | ICD-10-CM | POA: Diagnosis not present

## 2018-07-21 DIAGNOSIS — R55 Syncope and collapse: Secondary | ICD-10-CM | POA: Diagnosis not present

## 2018-07-21 DIAGNOSIS — R64 Cachexia: Secondary | ICD-10-CM | POA: Diagnosis not present

## 2018-07-23 DIAGNOSIS — R64 Cachexia: Secondary | ICD-10-CM | POA: Diagnosis not present

## 2018-07-23 DIAGNOSIS — R531 Weakness: Secondary | ICD-10-CM | POA: Diagnosis not present

## 2018-07-23 DIAGNOSIS — I10 Essential (primary) hypertension: Secondary | ICD-10-CM | POA: Diagnosis not present

## 2018-07-23 DIAGNOSIS — R52 Pain, unspecified: Secondary | ICD-10-CM | POA: Diagnosis not present

## 2018-07-23 DIAGNOSIS — R55 Syncope and collapse: Secondary | ICD-10-CM | POA: Diagnosis not present

## 2018-07-25 DIAGNOSIS — R55 Syncope and collapse: Secondary | ICD-10-CM | POA: Diagnosis not present

## 2018-07-25 DIAGNOSIS — R64 Cachexia: Secondary | ICD-10-CM | POA: Diagnosis not present

## 2018-07-25 DIAGNOSIS — R52 Pain, unspecified: Secondary | ICD-10-CM | POA: Diagnosis not present

## 2018-07-25 DIAGNOSIS — R531 Weakness: Secondary | ICD-10-CM | POA: Diagnosis not present

## 2018-07-25 DIAGNOSIS — I10 Essential (primary) hypertension: Secondary | ICD-10-CM | POA: Diagnosis not present

## 2018-07-30 DIAGNOSIS — R531 Weakness: Secondary | ICD-10-CM | POA: Diagnosis not present

## 2018-07-30 DIAGNOSIS — R55 Syncope and collapse: Secondary | ICD-10-CM | POA: Diagnosis not present

## 2018-07-30 DIAGNOSIS — R64 Cachexia: Secondary | ICD-10-CM | POA: Diagnosis not present

## 2018-07-30 DIAGNOSIS — I10 Essential (primary) hypertension: Secondary | ICD-10-CM | POA: Diagnosis not present

## 2018-07-30 DIAGNOSIS — R52 Pain, unspecified: Secondary | ICD-10-CM | POA: Diagnosis not present

## 2018-08-06 DIAGNOSIS — R64 Cachexia: Secondary | ICD-10-CM | POA: Diagnosis not present

## 2018-08-06 DIAGNOSIS — R55 Syncope and collapse: Secondary | ICD-10-CM | POA: Diagnosis not present

## 2018-08-06 DIAGNOSIS — R531 Weakness: Secondary | ICD-10-CM | POA: Diagnosis not present

## 2018-08-06 DIAGNOSIS — I10 Essential (primary) hypertension: Secondary | ICD-10-CM | POA: Diagnosis not present

## 2018-08-06 DIAGNOSIS — R52 Pain, unspecified: Secondary | ICD-10-CM | POA: Diagnosis not present

## 2018-08-13 DIAGNOSIS — I10 Essential (primary) hypertension: Secondary | ICD-10-CM | POA: Diagnosis not present

## 2018-08-13 DIAGNOSIS — R64 Cachexia: Secondary | ICD-10-CM | POA: Diagnosis not present

## 2018-08-13 DIAGNOSIS — R55 Syncope and collapse: Secondary | ICD-10-CM | POA: Diagnosis not present

## 2018-08-13 DIAGNOSIS — R531 Weakness: Secondary | ICD-10-CM | POA: Diagnosis not present

## 2018-08-13 DIAGNOSIS — R52 Pain, unspecified: Secondary | ICD-10-CM | POA: Diagnosis not present

## 2018-08-15 DIAGNOSIS — I10 Essential (primary) hypertension: Secondary | ICD-10-CM | POA: Diagnosis not present

## 2018-08-15 DIAGNOSIS — R55 Syncope and collapse: Secondary | ICD-10-CM | POA: Diagnosis not present

## 2018-08-15 DIAGNOSIS — R52 Pain, unspecified: Secondary | ICD-10-CM | POA: Diagnosis not present

## 2018-08-15 DIAGNOSIS — R531 Weakness: Secondary | ICD-10-CM | POA: Diagnosis not present

## 2018-08-15 DIAGNOSIS — R64 Cachexia: Secondary | ICD-10-CM | POA: Diagnosis not present

## 2018-08-19 DIAGNOSIS — R55 Syncope and collapse: Secondary | ICD-10-CM | POA: Diagnosis not present

## 2018-08-19 DIAGNOSIS — I10 Essential (primary) hypertension: Secondary | ICD-10-CM | POA: Diagnosis not present

## 2018-08-19 DIAGNOSIS — R531 Weakness: Secondary | ICD-10-CM | POA: Diagnosis not present

## 2018-08-19 DIAGNOSIS — R64 Cachexia: Secondary | ICD-10-CM | POA: Diagnosis not present

## 2018-08-19 DIAGNOSIS — R52 Pain, unspecified: Secondary | ICD-10-CM | POA: Diagnosis not present

## 2018-08-20 DIAGNOSIS — I10 Essential (primary) hypertension: Secondary | ICD-10-CM | POA: Diagnosis not present

## 2018-08-20 DIAGNOSIS — R55 Syncope and collapse: Secondary | ICD-10-CM | POA: Diagnosis not present

## 2018-08-20 DIAGNOSIS — R531 Weakness: Secondary | ICD-10-CM | POA: Diagnosis not present

## 2018-08-20 DIAGNOSIS — R52 Pain, unspecified: Secondary | ICD-10-CM | POA: Diagnosis not present

## 2018-08-20 DIAGNOSIS — R64 Cachexia: Secondary | ICD-10-CM | POA: Diagnosis not present

## 2018-08-22 DIAGNOSIS — R64 Cachexia: Secondary | ICD-10-CM | POA: Diagnosis not present

## 2018-08-22 DIAGNOSIS — R52 Pain, unspecified: Secondary | ICD-10-CM | POA: Diagnosis not present

## 2018-08-22 DIAGNOSIS — I10 Essential (primary) hypertension: Secondary | ICD-10-CM | POA: Diagnosis not present

## 2018-08-22 DIAGNOSIS — R55 Syncope and collapse: Secondary | ICD-10-CM | POA: Diagnosis not present

## 2018-08-22 DIAGNOSIS — R531 Weakness: Secondary | ICD-10-CM | POA: Diagnosis not present

## 2018-08-23 DIAGNOSIS — R52 Pain, unspecified: Secondary | ICD-10-CM | POA: Diagnosis not present

## 2018-08-23 DIAGNOSIS — I10 Essential (primary) hypertension: Secondary | ICD-10-CM | POA: Diagnosis not present

## 2018-08-23 DIAGNOSIS — R531 Weakness: Secondary | ICD-10-CM | POA: Diagnosis not present

## 2018-08-23 DIAGNOSIS — R55 Syncope and collapse: Secondary | ICD-10-CM | POA: Diagnosis not present

## 2018-08-23 DIAGNOSIS — R64 Cachexia: Secondary | ICD-10-CM | POA: Diagnosis not present

## 2018-08-24 DIAGNOSIS — I10 Essential (primary) hypertension: Secondary | ICD-10-CM | POA: Diagnosis not present

## 2018-08-24 DIAGNOSIS — R52 Pain, unspecified: Secondary | ICD-10-CM | POA: Diagnosis not present

## 2018-08-24 DIAGNOSIS — R55 Syncope and collapse: Secondary | ICD-10-CM | POA: Diagnosis not present

## 2018-08-24 DIAGNOSIS — R531 Weakness: Secondary | ICD-10-CM | POA: Diagnosis not present

## 2018-08-24 DIAGNOSIS — R64 Cachexia: Secondary | ICD-10-CM | POA: Diagnosis not present

## 2018-08-27 DIAGNOSIS — R52 Pain, unspecified: Secondary | ICD-10-CM | POA: Diagnosis not present

## 2018-08-27 DIAGNOSIS — R64 Cachexia: Secondary | ICD-10-CM | POA: Diagnosis not present

## 2018-08-27 DIAGNOSIS — R55 Syncope and collapse: Secondary | ICD-10-CM | POA: Diagnosis not present

## 2018-08-27 DIAGNOSIS — R531 Weakness: Secondary | ICD-10-CM | POA: Diagnosis not present

## 2018-08-27 DIAGNOSIS — I10 Essential (primary) hypertension: Secondary | ICD-10-CM | POA: Diagnosis not present

## 2018-08-28 DIAGNOSIS — R531 Weakness: Secondary | ICD-10-CM | POA: Diagnosis not present

## 2018-08-28 DIAGNOSIS — I10 Essential (primary) hypertension: Secondary | ICD-10-CM | POA: Diagnosis not present

## 2018-08-28 DIAGNOSIS — R52 Pain, unspecified: Secondary | ICD-10-CM | POA: Diagnosis not present

## 2018-08-28 DIAGNOSIS — R64 Cachexia: Secondary | ICD-10-CM | POA: Diagnosis not present

## 2018-08-28 DIAGNOSIS — R55 Syncope and collapse: Secondary | ICD-10-CM | POA: Diagnosis not present

## 2018-08-29 DIAGNOSIS — R64 Cachexia: Secondary | ICD-10-CM | POA: Diagnosis not present

## 2018-08-29 DIAGNOSIS — R55 Syncope and collapse: Secondary | ICD-10-CM | POA: Diagnosis not present

## 2018-08-29 DIAGNOSIS — R531 Weakness: Secondary | ICD-10-CM | POA: Diagnosis not present

## 2018-08-29 DIAGNOSIS — R52 Pain, unspecified: Secondary | ICD-10-CM | POA: Diagnosis not present

## 2018-08-29 DIAGNOSIS — I10 Essential (primary) hypertension: Secondary | ICD-10-CM | POA: Diagnosis not present

## 2018-08-30 DIAGNOSIS — R55 Syncope and collapse: Secondary | ICD-10-CM | POA: Diagnosis not present

## 2018-08-30 DIAGNOSIS — R64 Cachexia: Secondary | ICD-10-CM | POA: Diagnosis not present

## 2018-08-30 DIAGNOSIS — R52 Pain, unspecified: Secondary | ICD-10-CM | POA: Diagnosis not present

## 2018-08-30 DIAGNOSIS — I10 Essential (primary) hypertension: Secondary | ICD-10-CM | POA: Diagnosis not present

## 2018-08-30 DIAGNOSIS — R531 Weakness: Secondary | ICD-10-CM | POA: Diagnosis not present

## 2018-08-31 DIAGNOSIS — R531 Weakness: Secondary | ICD-10-CM | POA: Diagnosis not present

## 2018-08-31 DIAGNOSIS — R55 Syncope and collapse: Secondary | ICD-10-CM | POA: Diagnosis not present

## 2018-08-31 DIAGNOSIS — I10 Essential (primary) hypertension: Secondary | ICD-10-CM | POA: Diagnosis not present

## 2018-08-31 DIAGNOSIS — R64 Cachexia: Secondary | ICD-10-CM | POA: Diagnosis not present

## 2018-08-31 DIAGNOSIS — R52 Pain, unspecified: Secondary | ICD-10-CM | POA: Diagnosis not present

## 2018-09-01 DIAGNOSIS — R531 Weakness: Secondary | ICD-10-CM | POA: Diagnosis not present

## 2018-09-01 DIAGNOSIS — R55 Syncope and collapse: Secondary | ICD-10-CM | POA: Diagnosis not present

## 2018-09-01 DIAGNOSIS — R64 Cachexia: Secondary | ICD-10-CM | POA: Diagnosis not present

## 2018-09-01 DIAGNOSIS — I10 Essential (primary) hypertension: Secondary | ICD-10-CM | POA: Diagnosis not present

## 2018-09-01 DIAGNOSIS — R52 Pain, unspecified: Secondary | ICD-10-CM | POA: Diagnosis not present

## 2018-09-02 DIAGNOSIS — I10 Essential (primary) hypertension: Secondary | ICD-10-CM | POA: Diagnosis not present

## 2018-09-02 DIAGNOSIS — R52 Pain, unspecified: Secondary | ICD-10-CM | POA: Diagnosis not present

## 2018-09-02 DIAGNOSIS — R531 Weakness: Secondary | ICD-10-CM | POA: Diagnosis not present

## 2018-09-02 DIAGNOSIS — R55 Syncope and collapse: Secondary | ICD-10-CM | POA: Diagnosis not present

## 2018-09-02 DIAGNOSIS — R64 Cachexia: Secondary | ICD-10-CM | POA: Diagnosis not present

## 2018-09-19 DEATH — deceased

## 2019-07-13 IMAGING — CT CT ABD-PELV W/ CM
2 of 5 series · 16 of 46 positions shown, 18 images · IV contrast (Isovue)
Comparison: None.

CLINICAL DATA: Abdominal pain

EXAM:
CT ABDOMEN AND PELVIS WITH CONTRAST
TECHNIQUE: Multidetector CT imaging of the abdomen and pelvis was performed
using the standard protocol following bolus administration of
intravenous contrast.
CONTRAST:  80mL 2HV20Q-CWW IOPAMIDOL (2HV20Q-CWW) INJECTION 61%

[Series 3: axial st · axial · 0.61mm/px · z∈[-419,-64]mm · 13 of 81 slices shown, 15 images]
[im 5/81  soft-tissue]
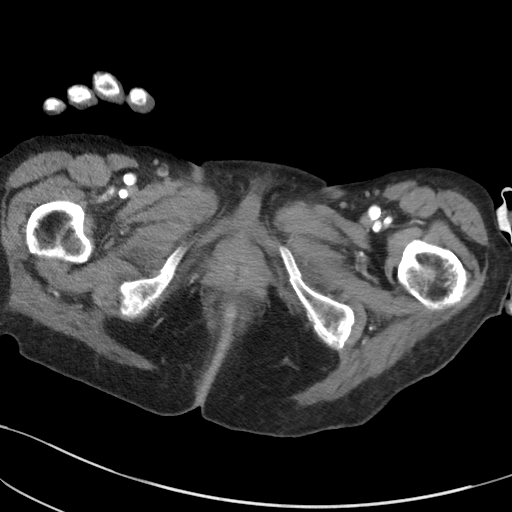
[im 5/81  bone]
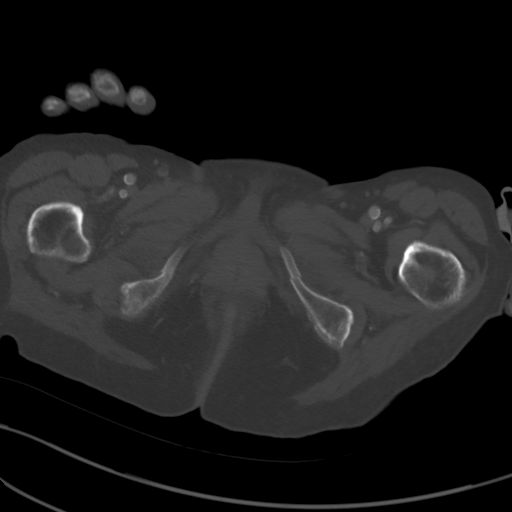
[im 13/81  soft-tissue]
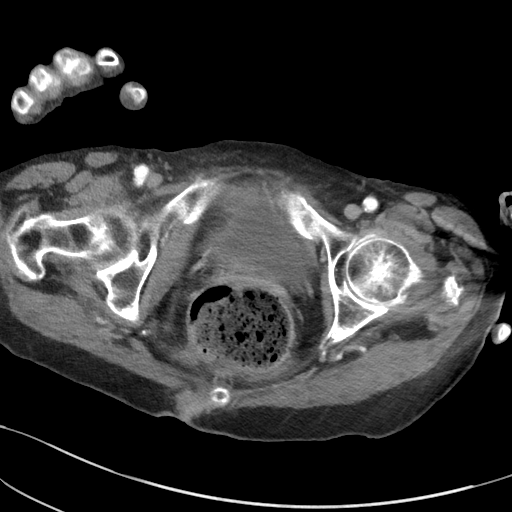
[im 17/81  soft-tissue]
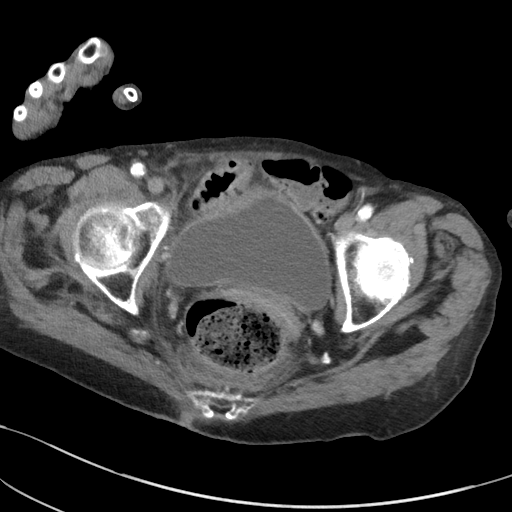
[im 22/81  soft-tissue]
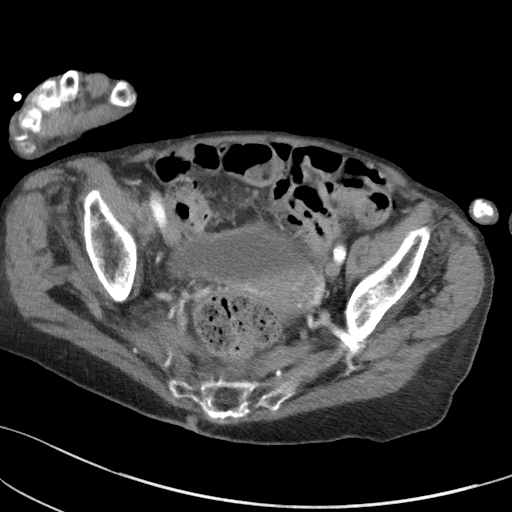
[im 30/81  soft-tissue]
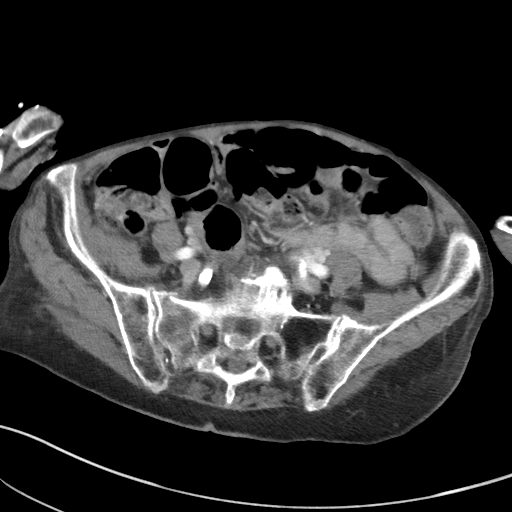
[im 34/81  soft-tissue]
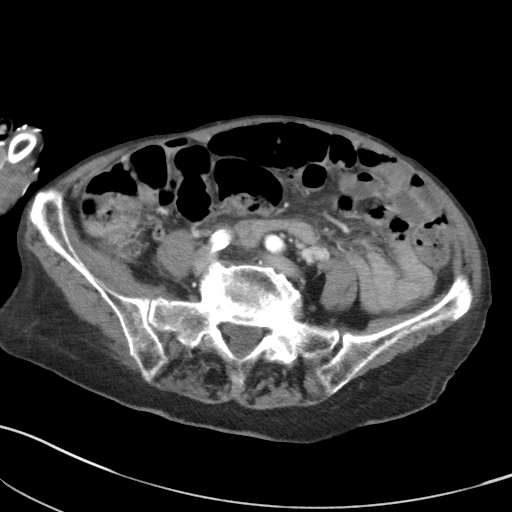
[im 43/81  soft-tissue]
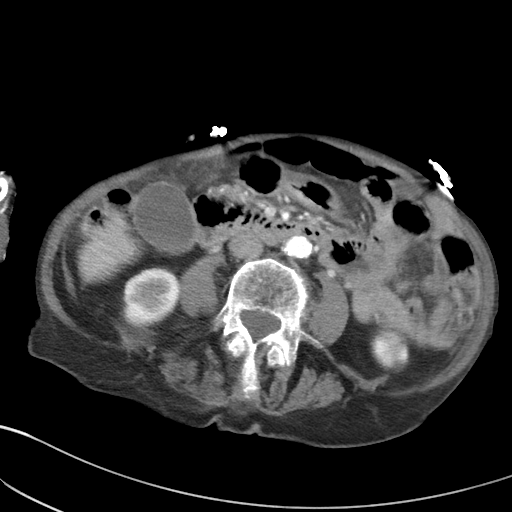
[im 47/81  soft-tissue]
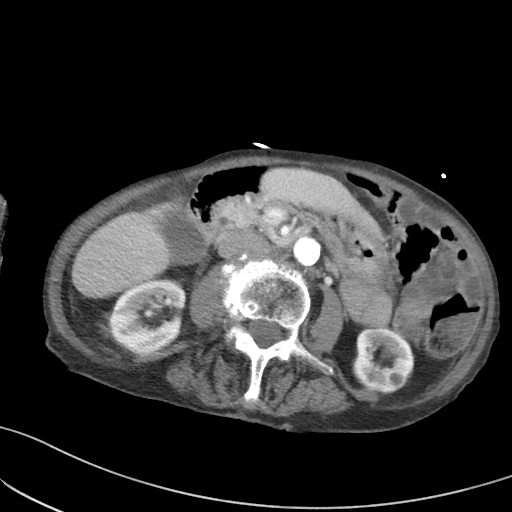
[im 51/81  soft-tissue]
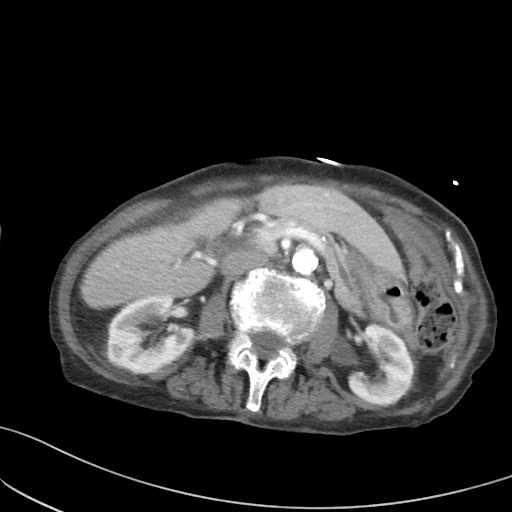
[im 51/81  bone]
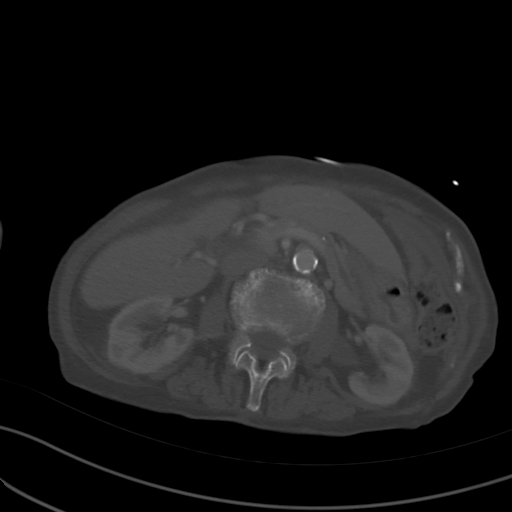
[im 59/81  soft-tissue]
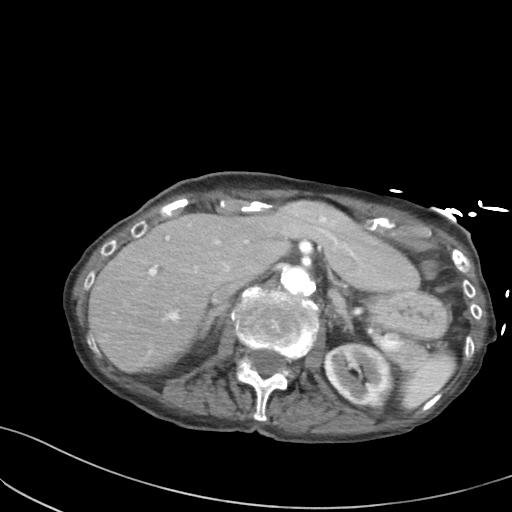
[im 64/81  soft-tissue]
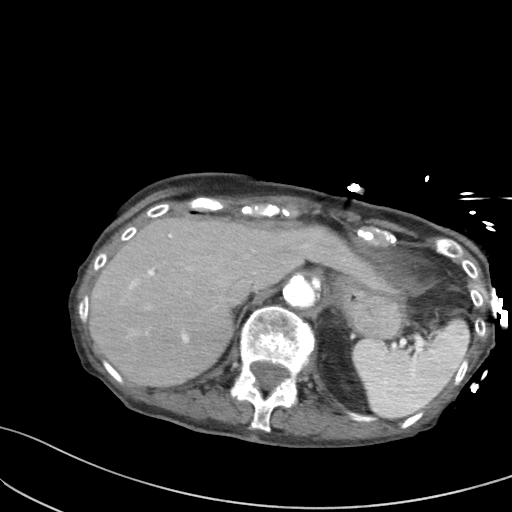
[im 68/81  soft-tissue]
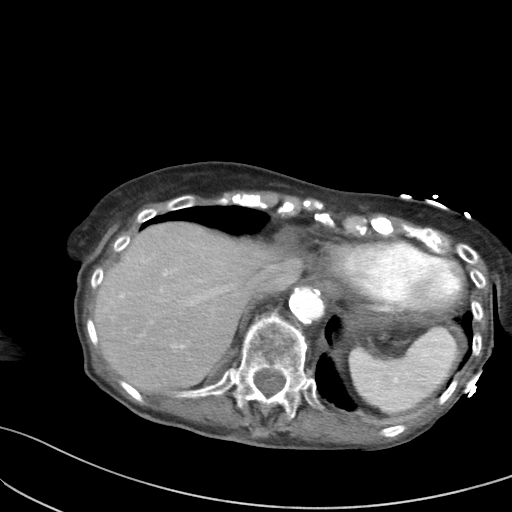
[im 76/81  soft-tissue]
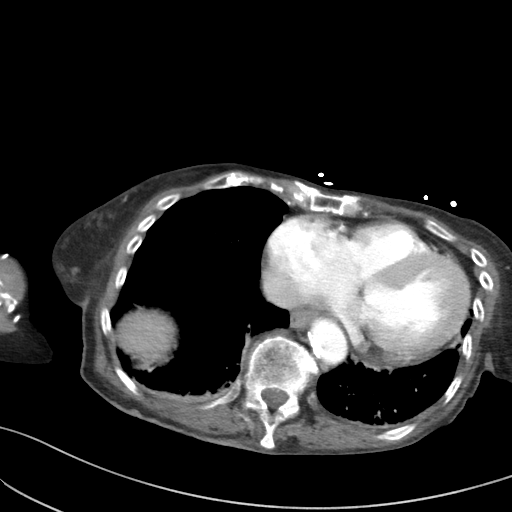

[Series 6: coronal st · coronal · 0.65mm/px · 3 of 63 slices shown]
[im 21/63  soft-tissue]
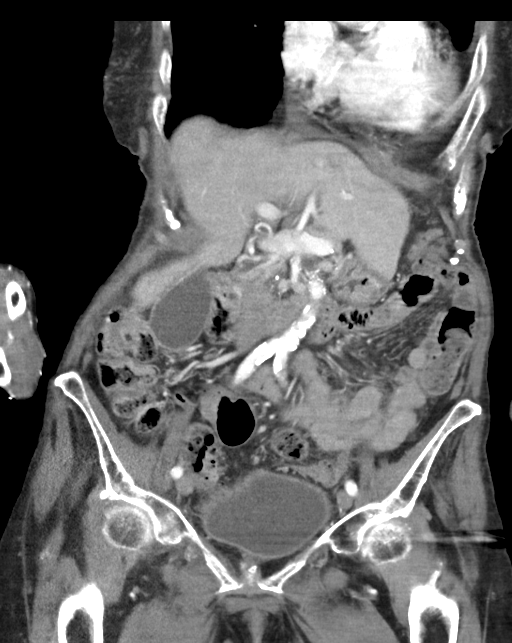
[im 28/63  soft-tissue]
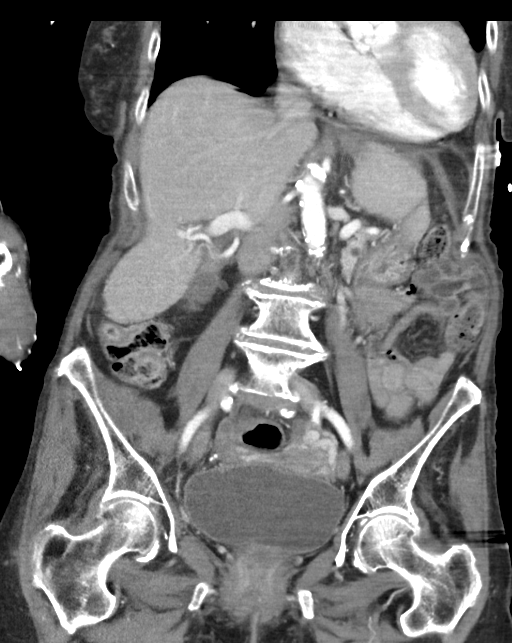
[im 35/63  soft-tissue]
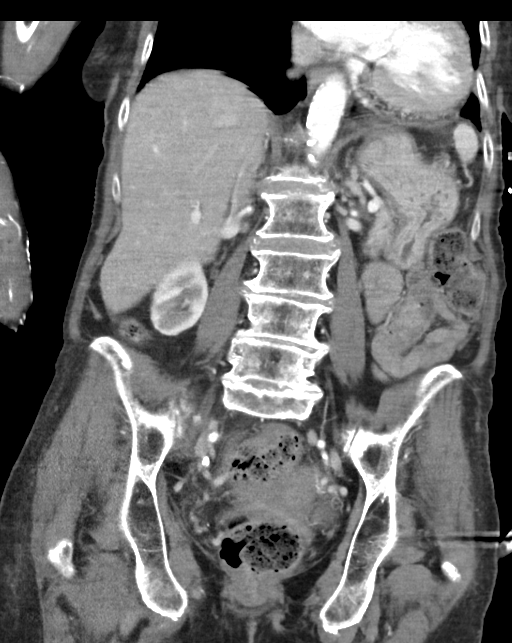

[16 of 46 positions shown; findings below may reference images not displayed]

FINDINGS: Lower chest: Mild dependent atelectatic changes are noted.

Hepatobiliary: No focal liver abnormality is seen. No gallstones,
gallbladder wall thickening, or biliary dilatation.

Pancreas: Unremarkable. No pancreatic ductal dilatation or
surrounding inflammatory changes.

Spleen: Normal in size without focal abnormality.

Adrenals/Urinary Tract: Adrenal glands are within normal limits.
Kidneys are well visualized with a normal enhancement pattern.
Scattered small cysts are seen. No renal calculi or obstructive
changes are seen. The bladder is partially distended.

Stomach/Bowel: Stomach is within normal limits. Appendix appears
normal. No evidence of bowel wall thickening, distention, or
inflammatory changes.

Vascular/Lymphatic: Aortic atherosclerosis. No enlarged abdominal or
pelvic lymph nodes.

Reproductive: Uterus and bilateral adnexa are unremarkable.

Other: No abdominal wall hernia or abnormality. No abdominopelvic
ascites.

Musculoskeletal: Bilateral inferior and right superior pubic ramus
fractures are noted. Associated right sacral fracture is seen
superiorly. Degenerative changes of the lumbar spine are noted.
IMPRESSION: Multiple pelvic fractures consistent with the given clinical
history.

No acute abdominal abnormality is identified.
